# Patient Record
Sex: Female | Born: 1955 | Race: Black or African American | Hispanic: No | Marital: Single | Smoking: Never smoker
Health system: Southern US, Community
[De-identification: ages and names within clinical notes are randomized; demographics above are authoritative.]

## PROBLEM LIST (undated history)

## (undated) DIAGNOSIS — T7840XA Allergy, unspecified, initial encounter: Secondary | ICD-10-CM

## (undated) HISTORY — DX: Allergy, unspecified, initial encounter: T78.40XA

## (undated) HISTORY — PX: ABDOMINAL HYSTERECTOMY: SHX81

---

## 1998-12-06 ENCOUNTER — Other Ambulatory Visit: Admission: RE | Admit: 1998-12-06 | Discharge: 1998-12-06 | Payer: Self-pay | Admitting: Obstetrics and Gynecology

## 2000-04-02 ENCOUNTER — Encounter: Payer: Self-pay | Admitting: Obstetrics and Gynecology

## 2000-04-05 ENCOUNTER — Inpatient Hospital Stay (HOSPITAL_COMMUNITY): Admission: RE | Admit: 2000-04-05 | Discharge: 2000-04-08 | Payer: Self-pay | Admitting: Obstetrics and Gynecology

## 2001-08-08 ENCOUNTER — Ambulatory Visit (HOSPITAL_BASED_OUTPATIENT_CLINIC_OR_DEPARTMENT_OTHER): Admission: RE | Admit: 2001-08-08 | Discharge: 2001-08-08 | Payer: Self-pay | Admitting: Orthopedic Surgery

## 2002-09-23 ENCOUNTER — Emergency Department (HOSPITAL_COMMUNITY): Admission: EM | Admit: 2002-09-23 | Discharge: 2002-09-23 | Payer: Self-pay | Admitting: Emergency Medicine

## 2003-01-13 ENCOUNTER — Other Ambulatory Visit: Admission: RE | Admit: 2003-01-13 | Discharge: 2003-01-13 | Payer: Self-pay | Admitting: Obstetrics and Gynecology

## 2013-07-01 DIAGNOSIS — Z0000 Encounter for general adult medical examination without abnormal findings: Secondary | ICD-10-CM | POA: Insufficient documentation

## 2013-07-07 ENCOUNTER — Ambulatory Visit (INDEPENDENT_AMBULATORY_CARE_PROVIDER_SITE_OTHER): Payer: Managed Care, Other (non HMO) | Admitting: Emergency Medicine

## 2013-07-07 VITALS — BP 120/62 | HR 79 | Temp 98.3°F | Resp 16 | Ht 63.25 in | Wt 112.6 lb

## 2013-07-07 DIAGNOSIS — L509 Urticaria, unspecified: Secondary | ICD-10-CM

## 2013-07-07 DIAGNOSIS — R197 Diarrhea, unspecified: Secondary | ICD-10-CM

## 2013-07-07 LAB — POCT CBC
Granulocyte percent: 60.3 %G (ref 37–80)
HCT, POC: 43 % (ref 37.7–47.9)
Hemoglobin: 13 g/dL (ref 12.2–16.2)
Lymph, poc: 1.5 (ref 0.6–3.4)
MCH, POC: 27.2 pg (ref 27–31.2)
MCHC: 30.2 g/dL — AB (ref 31.8–35.4)
MCV: 90 fL (ref 80–97)
MID (cbc): 0.3 (ref 0–0.9)
MPV: 8.3 fL (ref 0–99.8)
POC Granulocyte: 2.8 (ref 2–6.9)
POC LYMPH PERCENT: 32.6 %L (ref 10–50)
POC MID %: 7.1 %M (ref 0–12)
Platelet Count, POC: 369 10*3/uL (ref 142–424)
RBC: 4.78 M/uL (ref 4.04–5.48)
RDW, POC: 12.3 %
WBC: 4.6 10*3/uL (ref 4.6–10.2)

## 2013-07-07 MED ORDER — LOPERAMIDE HCL 2 MG PO TABS: ORAL_TABLET | ORAL | Status: DC

## 2013-07-07 NOTE — Progress Notes (Signed)
[facility] [address], [place] [address] [phone]  Date:  07/07/2013   Name:  Tamara Cole   DOB:  [birth year]   MRN:  [id number]  PCP:  No PCP Per Patient    Chief Complaint: Sinus Congestion and Rash   History of Present Illness:  Tamara Cole is a 58 y.o. very pleasant female patient who presents with the following:  Treated a week ago with zpak for "URI".  She says she thought she had the flu with a fever and cough. Fever and cough have largely resolved.  Still has a mild cough that is productive of scant purulent mucous.  No fever or chills. No nausea or vomiting. No wheezing or shortness of breath.  Says that she developed diarrhea on Saturday. Nausea and vomiting have resolved.  Now has diarrhea that is watery started at 0400 today.  The patient has no complaint of blood, mucous, or pus in her stools. Has cramping abdominal pain.   Sunday developed a pruritic rash limited to her right buttock. No wheezing or shortness of breath.  There are no active problems to display for this patient.   Past Medical History  Diagnosis Date  . Allergy     History reviewed. No pertinent past surgical history.  History  Substance Use Topics  . Smoking status: Never Smoker   . Smokeless tobacco: Not on file  . Alcohol Use: No    Family History  Problem Relation Age of Onset  . Diabetes Mother     No Known Allergies  Medication list has been reviewed and updated.  No current outpatient prescriptions on file prior to visit.   No current facility-administered medications on file prior to visit.    Review of Systems:  As per HPI, otherwise negative.    Physical Examination: Filed Vitals:   07/07/13 1434  BP: 120/62  Pulse: 79  Temp: 98.3 F (36.8 C)  Resp: 16   Filed Vitals:   07/07/13 1434  Height: 5' 3.25" (1.607 m)  Weight: 112 lb 9.6 oz (51.075 kg)   Body mass index is 19.78 kg/(m^2). Ideal Body Weight: Weight in  (lb) to have BMI = 25: 142  GEN: WDWN, NAD, Non-toxic, A & O x 3 HEENT: Atraumatic, Normocephalic. Neck supple. No masses, No LAD. Ears and Nose: No external deformity. CV: RRR, No M/G/R. No JVD. No thrill. No extra heart sounds. PULM: CTA B, no wheezes, crackles, rhonchi. No retractions. No resp. distress. No accessory muscle use. ABD: S, NT, ND, hyperactive BS. No rebound. No HSM. EXTR: No c/c/e NEURO Normal gait.  PSYCH: Normally interactive. Conversant. Not depressed or anxious appearing.  Calm demeanor.  SKIN:  Pruritic rash right buttock  Assessment and Plan: Antibiotic associated diarrhea Consider C. Dif Labs Urticaria Avoid zithromax Benadryl imodium  Signed,  Phillips OdorJeffery Kirstina Leinweber, MD

## 2013-07-07 NOTE — Patient Instructions (Signed)
Clostridium Difficile Infection Clostridium difficile (C. difficile) is a bacteria found in the intestinal tract or colon. Under certain conditions, it causes diarrhea and sometimes severe disease. The severe form of the disease is known as pseudomembranous colitis (often called C. difficile colitis). This disease can damage the lining of the colon or cause the colon to become enlarged (toxic megacolon).  CAUSES  Your colon normally contains many different bacteria, including C. difficile. The balance of bacteria in your colon can change during illness. This is especially true when you take antibiotic medicine. Taking antibiotics may allow the C. difficile to grow, multiply excessively, and make a toxin that then causes illness. The elderly and people with certain medical conditions have a greater risk of getting C. difficile infections. SYMPTOMS   Watery diarrhea.  Fever.  Fatigue.  Loss of appetite.  Nausea.  Abdominal swelling, pain, or tenderness.  Dehydration. DIAGNOSIS  Your symptoms may make your caregiver suspicious of a C. difficile infection, especially if you have used antibiotics in the preceding weeks. However, there are only 2 ways to know for certain whether you have a C. difficile infection:  A lab test that finds the toxin in your stool.  The specific appearance of an abnormality (pseudomembrane) in your colon. This can only be seen by doing a sigmoidoscopy or colonoscopy. These procedures involve passing an instrument through your rectum to look at the inside of your colon. Your caregiver will help determine if these tests are necessary. TREATMENT   Most people are successfully treated with one of two specific antibiotics, usually given by mouth. Other antibiotics you are receiving are stopped if possible.  Intravenous (IV) fluids and correction of electrolyte imbalance may be necessary.  Rarely, surgery may be needed to remove the infected part of the  intestines.  Careful hand washing by you and your caregivers is important to prevent the spread of infection. In the hospital, your caregivers may also put on gowns and gloves to prevent the spread of the C. difficile bacteria. Your room is also cleaned regularly with a solution containing bleach or a product that is known to kill C. difficile. HOME CARE INSTRUCTIONS  Drink enough fluids to keep your urine clear or pale yellow. Avoid milk, caffeine, and alcohol.  Ask your caregiver for specific rehydration instructions.  Try eating small, frequent meals rather than large meals.  Take your antibiotics as directed. Finish them even if you start to feel better.  Do not use medicines to slow diarrhea. This could delay healing or cause complications.  Wash your hands thoroughly after using the bathroom and before preparing food.  Make sure people who live with you wash their hands often, too.  Carefully disinfect all surfaces with a product that contains chlorine bleach. SEEK MEDICAL CARE IF:  Diarrhea persists longer than expected or recurs after completing your course of antibiotic treatment for the C. difficile infection.  You have trouble staying hydrated. SEEK IMMEDIATE MEDICAL CARE IF:  You develop a new fever.  You have increasing abdominal pain or tenderness.  There is blood in your stools, or your stools are dark black and tarry.  You cannot hold down food or liquids. MAKE SURE YOU:   Understand these instructions.  Will watch your condition.  Will get help right away if you are not doing well or get worse. Document Released: 03/14/2005 Document Revised: 09/29/2012 Document Reviewed: 11/10/2010 [employer] Patient Information 2014 [employer].  

## 2013-07-09 LAB — CLOSTRIDIUM DIFFICILE EIA: CDIFTX: NEGATIVE

## 2015-01-24 ENCOUNTER — Ambulatory Visit (INDEPENDENT_AMBULATORY_CARE_PROVIDER_SITE_OTHER): Payer: Managed Care, Other (non HMO) | Admitting: Family Medicine

## 2015-01-24 VITALS — BP 138/70 | HR 61 | Temp 98.3°F | Resp 15 | Ht 63.5 in | Wt 114.0 lb

## 2015-01-24 DIAGNOSIS — Z0000 Encounter for general adult medical examination without abnormal findings: Secondary | ICD-10-CM | POA: Diagnosis not present

## 2015-01-24 DIAGNOSIS — Z833 Family history of diabetes mellitus: Secondary | ICD-10-CM | POA: Diagnosis not present

## 2015-01-24 NOTE — Patient Instructions (Signed)
We will let you know the results of your bloodwork  You should be contacted with regard to the referral for a mammogram. If you do not hear from someone in the next week about that please call back and ask what became of your mammogram referral.  Return at anytime if needed

## 2015-01-24 NOTE — Progress Notes (Signed)
Physical examination:  History: 59 year old lady here for annual physical examination. This required by her job. She has no major complaints.  Past medical history: Medications: None Allergies: None Past illnesses: No major illnesses Surgeries: Hysterectomy  Family history: Mother has diabetes and is still alive. Her father was killed. She has a couple of brothers and sisters all of whom are living and well.  Social history: Divorced twice. No children. Works for [employer] at [employer] in [place]. She has a paint headache. She plays some tennis. She lives alone. Does not smoke, drink, or use drugs. Occasionally goes to church. Does all her own work around the house.  Review of systems: Constitutional: Unremarkable HEENT: Unremarkable Restaurant: Unremarkable Cardiovascular: Unremarkable Gastrointestinal: Unremarkable Endocrinologic: Unremarkable Gastrointestinal: Genitourinary: Unremarkable Muscular skeletal: Unremarkable Dermatologic: Unremarkable Allergy/immunology: Unremarkable Neurological: Unremarkable Hematologic: Unremarkable Psychiatric: Unremarkable   Physical exam: Healthy lady in no major distress, pleasant, alert and oriented. TMs normal. Eyes PERRLA. Fundi benign. Throat clear. Teeth fairly good. Neck supple without nodes or thyromegaly. No carotid bruits. Chest is clear to all station. Heart regular without murmurs gallops or arrhythmias. No carotid bruits. Abdomen soft without organomegaly, masses, or tenderness. No axillary or inguinal nodes. Breasts normal without any masses. Extremities unremarkable. Skin unremarkable. Deep tender reflexes symmetrical 1+. Her pulses in her feet are good.  Assessment: Normal physical examination Family history of diabetes  Plan: Schedule mammogram Get screening labs including CBC, lipids, A1c, and cooperative metabolic panel

## 2015-01-25 LAB — CBC
HCT: 37.5 % (ref 36.0–46.0)
Hemoglobin: 12.3 g/dL (ref 12.0–15.0)
MCH: 27.3 pg (ref 26.0–34.0)
MCHC: 32.8 g/dL (ref 30.0–36.0)
MCV: 83.3 fL (ref 78.0–100.0)
MPV: 9.6 fL (ref 8.6–12.4)
Platelets: 361 10*3/uL (ref 150–400)
RBC: 4.5 MIL/uL (ref 3.87–5.11)
RDW: 12.8 % (ref 11.5–15.5)
WBC: 6.5 10*3/uL (ref 4.0–10.5)

## 2015-01-25 LAB — COMPREHENSIVE METABOLIC PANEL
ALT: 14 U/L (ref 6–29)
AST: 17 U/L (ref 10–35)
Albumin: 4.7 g/dL (ref 3.6–5.1)
Alkaline Phosphatase: 98 U/L (ref 33–130)
BUN: 12 mg/dL (ref 7–25)
CO2: 26 mmol/L (ref 20–31)
Calcium: 9.7 mg/dL (ref 8.6–10.4)
Chloride: 103 mmol/L (ref 98–110)
Creat: 0.64 mg/dL (ref 0.50–0.99)
Glucose, Bld: 87 mg/dL (ref 65–99)
Potassium: 3.9 mmol/L (ref 3.5–5.3)
Sodium: 141 mmol/L (ref 135–146)
Total Bilirubin: 0.6 mg/dL (ref 0.2–1.2)
Total Protein: 7.8 g/dL (ref 6.1–8.1)

## 2015-01-25 LAB — LIPID PANEL
Cholesterol: 267 mg/dL — ABNORMAL HIGH (ref 125–200)
HDL: 98 mg/dL (ref >=46)
LDL Cholesterol: 154 mg/dL — ABNORMAL HIGH (ref <130)
Total CHOL/HDL Ratio: 2.7 Ratio (ref <=5.0)
Triglycerides: 76 mg/dL (ref <150)
VLDL: 15 mg/dL (ref <30)

## 2015-01-25 LAB — HEMOGLOBIN A1C
Hgb A1c MFr Bld: 5.5 % (ref <5.7)
Mean Plasma Glucose: 111 mg/dL (ref <117)

## 2015-01-27 ENCOUNTER — Other Ambulatory Visit: Payer: Self-pay

## 2015-01-27 DIAGNOSIS — Z1231 Encounter for screening mammogram for malignant neoplasm of breast: Secondary | ICD-10-CM

## 2015-01-28 ENCOUNTER — Encounter: Payer: Self-pay | Admitting: Family Medicine

## 2016-01-19 ENCOUNTER — Ambulatory Visit (INDEPENDENT_AMBULATORY_CARE_PROVIDER_SITE_OTHER): Payer: Managed Care, Other (non HMO) | Admitting: Emergency Medicine

## 2016-01-19 ENCOUNTER — Encounter: Payer: Self-pay | Admitting: Emergency Medicine

## 2016-01-19 VITALS — BP 122/74 | HR 62 | Temp 98.1°F | Resp 17 | Ht 63.5 in | Wt 119.0 lb

## 2016-01-19 DIAGNOSIS — Z1211 Encounter for screening for malignant neoplasm of colon: Secondary | ICD-10-CM

## 2016-01-19 DIAGNOSIS — Z833 Family history of diabetes mellitus: Secondary | ICD-10-CM

## 2016-01-19 DIAGNOSIS — Z0000 Encounter for general adult medical examination without abnormal findings: Secondary | ICD-10-CM

## 2016-01-19 DIAGNOSIS — Z1159 Encounter for screening for other viral diseases: Secondary | ICD-10-CM | POA: Diagnosis not present

## 2016-01-19 LAB — POCT CBC
Granulocyte percent: 63.6 %G (ref 37–80)
HEMATOCRIT: 37.4 % — AB (ref 37.7–47.9)
HEMOGLOBIN: 12.6 g/dL (ref 12.2–16.2)
LYMPH, POC: 1.6 (ref 0.6–3.4)
MCH, POC: 28.1 pg (ref 27–31.2)
MCHC: 33.8 g/dL (ref 31.8–35.4)
MCV: 82.9 fL (ref 80–97)
MID (cbc): 0.1 (ref 0–0.9)
MPV: 7.6 fL (ref 0–99.8)
POC Granulocyte: 3 (ref 2–6.9)
POC LYMPH PERCENT: 33.9 %L (ref 10–50)
POC MID %: 2.5 % (ref 0–12)
Platelet Count, POC: 315 10*3/uL (ref 142–424)
RBC: 4.51 M/uL (ref 4.04–5.48)
RDW, POC: 13 %
WBC: 4.7 10*3/uL (ref 4.6–10.2)

## 2016-01-19 LAB — POCT GLYCOSYLATED HEMOGLOBIN (HGB A1C): Hemoglobin A1C: 5.3

## 2016-01-19 LAB — GLUCOSE, POCT (MANUAL RESULT ENTRY): POC Glucose: 83 mg/dl (ref 70–99)

## 2016-01-19 NOTE — Patient Instructions (Addendum)
IF you received an x-ray today, you will receive an invoice from [facility]. Please contact [facility] at [phone] with questions or concerns regarding your invoice.   IF you received labwork today, you will receive an invoice from [employer]. Please contact [facility] at [phone] with questions or concerns regarding your invoice.   Our billing staff will not be able to assist you with questions regarding bills from these companies.  You will be contacted with the lab results as soon as they are available. The fastest way to get your results is to activate your My Chart account. Instructions are located on the last page of this paperwork. If you have not heard from [place] regarding the results in 2 weeks, please contact this office.       We recommend that you schedule a mammogram for breast cancer screening. Typically, you do not need a referral to do this. Please contact a local imaging center to schedule your mammogram.  [facility] - [phone]  *ask for the Radiology [facility]) - [phone] or [phone]  [facility] - [phone] [place] [phone] [facility] - [phone]  *ask for the [facility] - [phone]  *ask for the Radiology Department [facility] - [phone]  *ask for the Mammography Department [facility] - [phone] Health mainHealth Maintenance, Female Adopting a healthy lifestyle and getting preventive care can go a long way to promote health and wellness. Talk with your health care provider about what schedule of regular examinations is right for you. This is a good chance for you to check in with your provider about disease prevention and staying healthy. In between checkups, there are plenty of things you can do on your own. Experts have done  a lot of research about which lifestyle changes and preventive measures are most likely to keep you healthy. Ask your health care provider for more information. WEIGHT AND DIET  Eat a healthy diet  Be sure to include plenty of vegetables, fruits, low-fat dairy products, and lean protein.  Do not eat a lot of foods high in solid fats, added sugars, or salt.  Get regular exercise. This is one of the most important things you can do for your health.  Most adults should exercise for at least 150 minutes each week. The exercise should increase your heart rate and make you sweat (moderate-intensity exercise).  Most adults should also do strengthening exercises at least twice a week. This is in addition to the moderate-intensity exercise.  Maintain a healthy weight  Body mass index (BMI) is a measurement that can be used to identify possible weight problems. It estimates body fat based on height and weight. Your health care provider can help determine your BMI and help you achieve or maintain a healthy weight.  For females 44 years of age and older:   A BMI below 18.5 is considered underweight.  A BMI of 18.5 to 24.9 is normal.  A BMI of 25 to 29.9 is considered overweight.  A BMI of 30 and above is considered obese.  Watch levels of cholesterol and blood lipids  You should start having your blood tested for lipids and cholesterol at 62 years of age, then have this test every 5 years.  You may need to have your cholesterol levels checked more often if:  Your lipid or cholesterol levels are  high.  You are older than 62 years of age.  You are at high risk for heart disease.  CANCER SCREENING   Lung Cancer  Lung cancer screening is recommended for adults 42-49 years old who are at high risk for lung cancer because of a history of smoking.  A yearly low-dose CT scan of the lungs is recommended for people who:  Currently smoke.  Have quit within the past 15 years.  Have at  least a 30-pack-year history of smoking. A pack year is smoking an average of one pack of cigarettes a day for 1 year.  Yearly screening should continue until it has been 15 years since you quit.  Yearly screening should stop if you develop a health problem that would prevent you from having lung cancer treatment.  Breast Cancer  Practice breast self-awareness. This means understanding how your breasts normally appear and feel.  It also means doing regular breast self-exams. Let your health care provider know about any changes, no matter how small.  If you are in your 20s or 30s, you should have a clinical breast exam (CBE) by a health care provider every 1-3 years as part of a regular health exam.  If you are 73 or older, have a CBE every year. Also consider having a breast X-ray (mammogram) every year.  If you have a family history of breast cancer, talk to your health care provider about genetic screening.  If you are at high risk for breast cancer, talk to your health care provider about having an MRI and a mammogram every year.  Breast cancer gene (BRCA) assessment is recommended for women who have family members with BRCA-related cancers. BRCA-related cancers include:  Breast.  Ovarian.  Tubal.  Peritoneal cancers.  Results of the assessment will determine the need for genetic counseling and BRCA1 and BRCA2 testing. Cervical Cancer Your health care provider may recommend that you be screened regularly for cancer of the pelvic organs (ovaries, uterus, and vagina). This screening involves a pelvic examination, including checking for microscopic changes to the surface of your cervix (Pap test). You may be encouraged to have this screening done every 3 years, beginning at age 75.  For women ages 1-65, health care providers may recommend pelvic exams and Pap testing every 3 years, or they may recommend the Pap and pelvic exam, combined with testing for human papilloma virus (HPV),  every 5 years. Some types of HPV increase your risk of cervical cancer. Testing for HPV may also be done on women of any age with unclear Pap test results.  Other health care providers may not recommend any screening for nonpregnant women who are considered low risk for pelvic cancer and who do not have symptoms. Ask your health care provider if a screening pelvic exam is right for you.  If you have had past treatment for cervical cancer or a condition that could lead to cancer, you need Pap tests and screening for cancer for at least 20 years after your treatment. If Pap tests have been discontinued, your risk factors (such as having a new sexual partner) need to be reassessed to determine if screening should resume. Some women have medical problems that increase the chance of getting cervical cancer. In these cases, your health care provider may recommend more frequent screening and Pap tests. Colorectal Cancer  This type of cancer can be detected and often prevented.  Routine colorectal cancer screening usually begins at 62 years of age and continues through 62  years of age.  Your health care provider may recommend screening at an earlier age if you have risk factors for colon cancer.  Your health care provider may also recommend using home test kits to check for hidden blood in the stool.  A small camera at the end of a tube can be used to examine your colon directly (sigmoidoscopy or colonoscopy). This is done to check for the earliest forms of colorectal cancer.  Routine screening usually begins at age 103.  Direct examination of the colon should be repeated every 5-10 years through 62 years of age. However, you may need to be screened more often if early forms of precancerous polyps or small growths are found. Skin Cancer  Check your skin from head to toe regularly.  Tell your health care provider about any new moles or changes in moles, especially if there is a change in a mole's shape  or color.  Also tell your health care provider if you have a mole that is larger than the size of a pencil eraser.  Always use sunscreen. Apply sunscreen liberally and repeatedly throughout the day.  Protect yourself by wearing long sleeves, pants, a wide-brimmed hat, and sunglasses whenever you are outside. HEART DISEASE, DIABETES, AND HIGH BLOOD PRESSURE   High blood pressure causes heart disease and increases the risk of stroke. High blood pressure is more likely to develop in:  People who have blood pressure in the high end of the normal range (130-139/85-89 mm Hg).  People who are overweight or obese.  People who are African American.  If you are 68-3 years of age, have your blood pressure checked every 3-5 years. If you are 18 years of age or older, have your blood pressure checked every year. You should have your blood pressure measured twice--once when you are at a hospital or clinic, and once when you are not at a hospital or clinic. Record the average of the two measurements. To check your blood pressure when you are not at a hospital or clinic, you can use:  An automated blood pressure machine at a pharmacy.  A home blood pressure monitor.  If you are between 55 years and 64 years old, ask your health care provider if you should take aspirin to prevent strokes.  Have regular diabetes screenings. This involves taking a blood sample to check your fasting blood sugar level.  If you are at a normal weight and have a low risk for diabetes, have this test once every three years after 62 years of age.  If you are overweight and have a high risk for diabetes, consider being tested at a younger age or more often. PREVENTING INFECTION  Hepatitis B  If you have a higher risk for hepatitis B, you should be screened for this virus. You are considered at high risk for hepatitis B if:  You were born in a country where hepatitis B is common. Ask your health care provider which  countries are considered high risk.  Your parents were born in a high-risk country, and you have not been immunized against hepatitis B (hepatitis B vaccine).  You have HIV or AIDS.  You use needles to inject street drugs.  You live with someone who has hepatitis B.  You have had sex with someone who has hepatitis B.  You get hemodialysis treatment.  You take certain medicines for conditions, including cancer, organ transplantation, and autoimmune conditions. Hepatitis C  Blood testing is recommended for:  Everyone born  from [place] through 1967.  Anyone with known risk factors for hepatitis C. Sexually transmitted infections (STIs)  You should be screened for sexually transmitted infections (STIs) including gonorrhea and chlamydia if:  You are sexually active and are younger than 62 years of age.  You are older than 62 years of age and your health care provider tells you that you are at risk for this type of infection.  Your sexual activity has changed since you were last screened and you are at an increased risk for chlamydia or gonorrhea. Ask your health care provider if you are at risk.  If you do not have HIV, but are at risk, it may be recommended that you take a prescription medicine daily to prevent HIV infection. This is called pre-exposure prophylaxis (PrEP). You are considered at risk if:  You are sexually active and do not regularly use condoms or know the HIV status of your partner(s).  You take drugs by injection.  You are sexually active with a partner who has HIV. Talk with your health care provider about whether you are at high risk of being infected with HIV. If you choose to begin PrEP, you should first be tested for HIV. You should then be tested every 3 months for as long as you are taking PrEP.  PREGNANCY   If you are premenopausal and you may become pregnant, ask your health care provider about preconception counseling.  If you may become pregnant, take  400 to 800 micrograms (mcg) of folic acid every day.  If you want to prevent pregnancy, talk to your health care provider about birth control (contraception). OSTEOPOROSIS AND MENOPAUSE   Osteoporosis is a disease in which the bones lose minerals and strength with aging. This can result in serious bone fractures. Your risk for osteoporosis can be identified using a bone density scan.  If you are 93 years of age or older, or if you are at risk for osteoporosis and fractures, ask your health care provider if you should be screened.  Ask your health care provider whether you should take a calcium or vitamin D supplement to lower your risk for osteoporosis.  Menopause may have certain physical symptoms and risks.  Hormone replacement therapy may reduce some of these symptoms and risks. Talk to your health care provider about whether hormone replacement therapy is right for you.  HOME CARE INSTRUCTIONS   Schedule regular health, dental, and eye exams.  Stay current with your immunizations.   Do not use any tobacco products including cigarettes, chewing tobacco, or electronic cigarettes.  If you are pregnant, do not drink alcohol.  If you are breastfeeding, limit how much and how often you drink alcohol.  Limit alcohol intake to no more than 1 drink per day for nonpregnant women. One drink equals 12 ounces of beer, 5 ounces of wine, or 1 ounces of hard liquor.  Do not use street drugs.  Do not share needles.  Ask your health care provider for help if you need support or information about quitting drugs.  Tell your health care provider if you often feel depressed.  Tell your health care provider if you have ever been abused or do not feel safe at home.   This information is not intended to replace advice given to you by your health care provider. Make sure you discuss any questions you have with your health care provider.   Document Released: 12/18/2010 Document Revised:  06/25/2014 Document Reviewed: 05/06/2013 Elsevier Interactive Patient Education  2016 [employer].

## 2016-01-19 NOTE — Progress Notes (Signed)
Patient ID: Tamara Cole, female   DOB: 08-16-1955, 60 y.o.   MRN: [id number]    By signing my name below I, Tamara Cole, attest that this documentation has been prepared under the direction and in the presence of Lesle Chris, MD. Electonically Signed. Tamara Cole, Scribe 01/19/2016 at 3:21 PM  Chief Complaint:  Chief Complaint  Patient presents with  . Annual Exam    urine collected    HPI: Tamara Cole is a 60 y.o. female who reports to [facility] today for annual exam for work.   Pt denies history of HTN, DM, or HLD. Pt's mother has DM. No other family history of DM.   Pt is scheduled for a mammogram. Pt has had a colonoscopy within the past 5 years approximately.  Pt reports going to see her eye doctor annually.    Pt has not eaten today.  Pt has had a hysterectomy.   Pt's last tetanus shot was 3 years ago.   Pt works at [employer] in [place].    Past Medical History:  Diagnosis Date  . Allergy    Past Surgical History:  Procedure Laterality Date  . ABDOMINAL HYSTERECTOMY     Social History   Social History  . Marital status: Single    Spouse name: N/A  . Number of children: N/A  . Years of education: N/A   Social History Main Topics  . Smoking status: Never Smoker  . Smokeless tobacco: None  . Alcohol use No  . Drug use: No  . Sexual activity: Not Asked   Other Topics Concern  . None   Social History Narrative  . None   Family History  Problem Relation Age of Onset  . Diabetes Mother    No Known Allergies Prior to Admission medications   Not on File     ROS: The patient denies fevers, chills, night sweats, unintentional weight loss, chest pain, palpitations, wheezing, dyspnea on exertion, nausea, vomiting, abdominal pain, dysuria, hematuria, melena, numbness, weakness, or tingling.   All other systems have been reviewed and were otherwise negative with the exception of those mentioned in the HPI and as above.    PHYSICAL  EXAM: Vitals:   01/19/16 1439  BP: 122/74  Pulse: 62  Resp: 17  Temp: 98.1 F (36.7 C)   Body mass index is 20.75 kg/m.   General: Alert, no acute distress HEENT:  Normocephalic, atraumatic, oropharynx patent. TMs normal bilat. No thyromegaly.  Eye: [facility] Cardiovascular:  Regular rate and rhythm, no rubs murmurs or gallops.  No Carotid bruits, radial pulse intact. No pedal edema.  Respiratory: Clear to auscultation bilaterally.  No wheezes, rales, or rhonchi.  No cyanosis, no use of accessory musculature Abdominal: No organomegaly, abdomen is soft and non-tender, positive bowel sounds.  No masses. Musculoskeletal: Gait intact. No edema, tenderness Skin: No rashes. Neurologic: Facial musculature symmetric. Psychiatric: Patient acts appropriately throughout our interaction. Lymphatic: No cervical or submandibular lymphadenopathy GU: Breasts normal.   Body mass index is 20.75 kg/m. LABS:  Results for orders placed or performed in visit on 01/19/16  POCT CBC  Result Value Ref Range   WBC 4.7 4.6 - 10.2 K/uL   Lymph, poc 1.6 0.6 - 3.4   POC LYMPH PERCENT 33.9 10 - 50 %L   MID (cbc) 0.1 0 - 0.9   POC MID % 2.5 0 - 12 %M   POC Granulocyte 3.0 2 - 6.9   Granulocyte percent 63.6 37 - 80 %G  RBC 4.51 4.04 - 5.48 M/uL   Hemoglobin 12.6 12.2 - 16.2 g/dL   HCT, POC 47.0 (A) 96.2 - 47.9 %   MCV 82.9 80 - 97 fL   MCH, POC 28.1 27 - 31.2 pg   MCHC 33.8 31.8 - 35.4 g/dL   RDW, POC 83.6 %   Platelet Count, POC 315 142 - 424 K/uL   MPV 7.6 0 - 99.8 fL  POCT glucose (manual entry)  Result Value Ref Range   POC Glucose 83 70 - 99 mg/dl  POCT glycosylated hemoglobin (Hb A1C)  Result Value Ref Range   Hemoglobin A1C 5.3     EKG/XRAY:   Primary read interpreted by Dr. Cleta Alberts at [facility].   ASSESSMENT/PLAN: Physical exam performed with routine lab screening. Referral made to GI for colonoscopy. Her form was signed.she has a healthy lifestyle no abnormalities on exam.I  personally performed the services described in this documentation, which was scribed in my presence. The recorded information has been reviewed and is accurate.    Gross sideeffects, risk and benefits, and alternatives of medications d/w patient. Patient is aware that all medications have potential sideeffects and we are unable to predict every sideeffect or drug-drug interaction that may occur.  Lesle Chris MD 01/19/2016 3:08 PM

## 2016-01-20 LAB — COMPLETE METABOLIC PANEL WITH GFR
ALT: 18 U/L (ref 6–29)
AST: 20 U/L (ref 10–35)
Albumin: 4.4 g/dL (ref 3.6–5.1)
Alkaline Phosphatase: 96 U/L (ref 33–130)
BILIRUBIN TOTAL: 0.9 mg/dL (ref 0.2–1.2)
BUN: 11 mg/dL (ref 7–25)
CALCIUM: 9.6 mg/dL (ref 8.6–10.4)
CO2: 25 mmol/L (ref 20–31)
CREATININE: 0.62 mg/dL (ref 0.50–0.99)
Chloride: 106 mmol/L (ref 98–110)
Glucose, Bld: 87 mg/dL (ref 65–99)
Potassium: 4 mmol/L (ref 3.5–5.3)
Sodium: 143 mmol/L (ref 135–146)
TOTAL PROTEIN: 7.7 g/dL (ref 6.1–8.1)

## 2016-01-20 LAB — LIPID PANEL
CHOLESTEROL: 264 mg/dL — AB (ref 125–200)
HDL: 103 mg/dL (ref >=46)
LDL Cholesterol: 146 mg/dL — ABNORMAL HIGH (ref <130)
TRIGLYCERIDES: 76 mg/dL (ref <150)
Total CHOL/HDL Ratio: 2.6 Ratio (ref <=5.0)
VLDL: 15 mg/dL (ref <30)

## 2016-01-20 LAB — HEPATITIS C ANTIBODY: HCV Ab: NEGATIVE

## 2016-01-21 ENCOUNTER — Encounter: Payer: Self-pay | Admitting: *Deleted

## 2016-03-22 ENCOUNTER — Encounter: Payer: Self-pay | Admitting: Emergency Medicine

## 2017-02-22 ENCOUNTER — Encounter: Payer: Managed Care, Other (non HMO) | Admitting: Family Medicine

## 2017-02-23 ENCOUNTER — Encounter: Payer: Self-pay | Admitting: Family Medicine

## 2017-02-23 ENCOUNTER — Ambulatory Visit (INDEPENDENT_AMBULATORY_CARE_PROVIDER_SITE_OTHER): Payer: PRIVATE HEALTH INSURANCE | Admitting: Family Medicine

## 2017-02-23 VITALS — BP 118/71 | HR 65 | Temp 98.1°F | Resp 16 | Ht 63.0 in | Wt 103.0 lb

## 2017-02-23 DIAGNOSIS — E785 Hyperlipidemia, unspecified: Secondary | ICD-10-CM | POA: Diagnosis not present

## 2017-02-23 DIAGNOSIS — Z23 Encounter for immunization: Secondary | ICD-10-CM | POA: Diagnosis not present

## 2017-02-23 DIAGNOSIS — Z0000 Encounter for general adult medical examination without abnormal findings: Secondary | ICD-10-CM

## 2017-02-23 DIAGNOSIS — Z1231 Encounter for screening mammogram for malignant neoplasm of breast: Secondary | ICD-10-CM

## 2017-02-23 DIAGNOSIS — Z1211 Encounter for screening for malignant neoplasm of colon: Secondary | ICD-10-CM | POA: Diagnosis not present

## 2017-02-23 DIAGNOSIS — Z1239 Encounter for other screening for malignant neoplasm of breast: Secondary | ICD-10-CM

## 2017-02-23 NOTE — Patient Instructions (Addendum)
Nice meeting you.  We will refer you for colonoscopy and mammogram.  I will check some blood work for blood sugar, and cholesterol. Cholesterol was elevated the last year. Depending on that reading today, can decide if medication is needed.  HIV tests is recommended for all adults once. If you would like to have that added to your bloodwork today, please let me know by Tuesday.  Keeping You Healthy  Get These Tests  Blood Pressure- Have your blood pressure checked by your healthcare provider at least once a year.  Normal blood pressure is 120/80.  Weight- Have your body mass index (BMI) calculated to screen for obesity.  BMI is a measure of body fat based on height and weight.  You can calculate your own BMI at https://www.west-esparza.com/  Cholesterol- Have your cholesterol checked every year.  Diabetes- Have your blood sugar checked every year if you have high blood pressure, high cholesterol, a family history of diabetes or if you are overweight.  Pap Test - Have a pap test every 1 to 5 years if you have been sexually active.  If you are older than 65 and recent pap tests have been normal you may not need additional pap tests.  In addition, if you have had a hysterectomy  for benign disease additional pap tests are not necessary.  Mammogram-Yearly mammograms are essential for early detection of breast cancer  Screening for Colon Cancer- Colonoscopy starting at age 61. Screening may begin sooner depending on your family history and other health conditions.  Follow up colonoscopy as directed by your Gastroenterologist.  Screening for Osteoporosis- Screening begins at age 34 with bone density scanning, sooner if you are at higher risk for developing Osteoporosis.  Get these medicines  Calcium with Vitamin D- Your body requires 1200-1500 mg of Calcium a day and [phone] IU of Vitamin D a day.  You can only absorb 500 mg of Calcium at a time therefore Calcium must be taken in 2 or 3 separate  doses throughout the day.  Hormones- Hormone therapy has been associated with increased risk for certain cancers and heart disease.  Talk to your healthcare provider about if you need relief from menopausal symptoms.  Aspirin- Ask your healthcare provider about taking Aspirin to prevent Heart Disease and Stroke.  Get these Immuniztions  Flu shot- Every fall  Pneumonia shot- Once after the age of 66; if you are younger ask your healthcare provider if you need a pneumonia shot.  Tetanus- Every ten years.  Zostavax- Once after the age of 56 to prevent shingles.  Take these steps  Don't smoke- Your healthcare provider can help you quit. For tips on how to quit, ask your healthcare provider or go to www.smokefree.gov or call 1-800 QUIT-NOW.  Be physically active- Exercise 5 days a week for a minimum of 30 minutes.  If you are not already physically active, start slow and gradually work up to 30 minutes of moderate physical activity.  Try walking, dancing, bike riding, swimming, etc.  Eat a healthy diet- Eat a variety of healthy foods such as fruits, vegetables, whole grains, low fat milk, low fat cheeses, yogurt, lean meats, chicken, fish, eggs, dried beans, tofu, etc.  For more information go to www.thenutritionsource.org  Dental visit- Brush and floss teeth twice daily; visit your dentist twice a year.  Eye exam- Visit your Optometrist or Ophthalmologist yearly.  Drink alcohol in moderation- Limit alcohol intake to one drink or less a day.  Never drink and drive.  Depression- Your emotional health is as important as your physical health.  If you're feeling down or losing interest in things you normally enjoy, please talk to your healthcare provider.  Seat Belts- can save your life; always wear one  Smoke/Carbon Monoxide detectors- These detectors need to be installed on the appropriate level of your home.  Replace batteries at least once a year.  Violence- If anyone is threatening or  hurting you, please tell your healthcare provider.  Living Will/ Health care power of attorney- Discuss with your healthcare provider and family.     IF you received an x-ray today, you will receive an invoice from [facility]. Please contact [facility] at [phone] with questions or concerns regarding your invoice.   IF you received labwork today, you will receive an invoice from [facility]. Please contact [facility] at [phone] with questions or concerns regarding your invoice.   Our billing staff will not be able to assist you with questions regarding bills from these companies.  You will be contacted with the lab results as soon as they are available. The fastest way to get your results is to activate your My Chart account. Instructions are located on the last page of this paperwork. If you have not heard from us regarding the results in 2 weeks, please contact this office.

## 2017-02-23 NOTE — Progress Notes (Signed)
Subjective:  By signing my name below, I, Stann Oresung-Kai Tsai, attest that this documentation has been prepared under the direction and in the presence of Meredith StaggersJeffrey Meilech Virts, MD. Electronically Signed: Stann Oresung-Kai Tsai, Scribe. 02/23/2017 , 10:09 AM .  Patient was seen in Room 10 .   Patient ID: Tamara Cole, female    DOB: Apr 11, 1956, 61 y.o.   MRN: [id number] Chief Complaint  Patient presents with  . Annual Exam   HPI  Tamara Cole is a 61 y.o. female  Here for annual physical. She was previously seen by Dr. Cleta Albertsaub for physical in 2017; no concerns at that time. She was referred for colonoscopy. Overall, she had normal labs, except hyperlipidemia.   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   Hyperlipidemia Lab Results  Component Value Date   CHOL 264 (H) 01/19/2016   HDL 103 01/19/2016   LDLCALC 146 (H) 01/19/2016   TRIG 76 01/19/2016   CHOLHDL 2.6 01/19/2016   Lab Results  Component Value Date   ALT 18 01/19/2016   AST 20 01/19/2016   ALKPHOS 96 01/19/2016   BILITOT 0.9 01/19/2016   She was recommended Lipitor, recheck in 3-6 months. She hasn't taken medication for this.   Recent deaths in family Brother: passed in March, to cancer in his throat Mother: passed in July, due to brain aneurysm, at 63 years old  Cancer Screening Colonoscopy: Referred last year as above, but unable to contact patient to schedule, so referral was closed. Wasn't rescheduled due to family emergencies.  Breast cancer screening: recommended to schedule mammogram; referral placed today.  Cervical cancer screening: s/p hysterectomy due to fibroids.  Bone density testing: She had this done several years ago, which was negative; denies family history of osteoporosis. She denies history of smoking.   Immunizations  There is no immunization history on file for this patient.  Tetanus: Tdap received on Sept 20th, 2012 Flu  shot: declines flu shot, as she receives flu shot at work.   HIV screening- She declines screening today, unsure if insurance covers; she denies being sexually active.  Hep C screening- done in Aug 2017, negative.   Depression Depression screen [facility] 2/[facility] 02/23/2017 01/19/2016 01/24/2015  Decreased Interest 0 0 0  Down, Depressed, Hopeless 0 0 0  PHQ - 2 Score 0 0 0    Vision  Visual Acuity Screening   Right eye Left eye Both eyes  Without correction:     With correction: 20/20 20/20 20/20    She saw eye doctor earlier this year; follows once a year.   Dentist She hasn't seen dentist in a few years; plans to schedule.   Exercise She works a physical job.  Exercise daily through activity at work  Family history of DM Her mother had DM.   Lab Results  Component Value Date   HGBA1C 5.3 01/19/2016    Patient Active Problem List   Diagnosis Date Noted  . Family history of diabetes mellitus 01/24/2015   Past Medical History:  Diagnosis Date  . Allergy    Past Surgical History:  Procedure Laterality Date  . ABDOMINAL HYSTERECTOMY     No Known Allergies Prior to Admission medications   Not on File   Social History   Social History  . Marital status: Single    Spouse name: N/A  . Number of children: N/A  . Years of education: N/A   Occupational History  .  Not on file.   Social History Main Topics  . Smoking status: Never Smoker  . Smokeless tobacco: Never Used  . Alcohol use No  . Drug use: No  . Sexual activity: Not on file   Other Topics Concern  . Not on file   Social History Narrative  . No narrative on file   Review of Systems 13 point ROS - negative     Objective:   Physical Exam  Constitutional: She is oriented to person, place, and time. She appears well-developed and well-nourished.  HENT:  Head: Normocephalic and atraumatic.  Right Ear: External ear normal.  Left Ear: External ear normal.  Mouth/Throat: Oropharynx is clear and moist.  Eyes:  Pupils are equal, round, and reactive to light. Conjunctivae are normal.  Neck: Normal range of motion. Neck supple. No thyromegaly present.  Cardiovascular: Normal rate, regular rhythm, normal heart sounds and intact distal pulses.   No murmur heard. Pulmonary/Chest: Effort normal and breath sounds normal. No respiratory distress. She has no wheezes.  Abdominal: Soft. Bowel sounds are normal. There is no tenderness.  Musculoskeletal: Normal range of motion. She exhibits no edema or tenderness.  Lymphadenopathy:    She has no cervical adenopathy.  Neurological: She is alert and oriented to person, place, and time.  Skin: Skin is warm and dry. No rash noted.  Psychiatric: She has a normal mood and affect. Her behavior is normal. Thought content normal.  Vitals reviewed.    Vitals:   02/23/17 0922  BP: 118/71  Pulse: 65  Resp: 16  Temp: 98.1 F (36.7 C)  TempSrc: Oral  SpO2: 99%  Weight: 103 lb (46.7 kg)  Height:  (1.6 m)      Assessment & Plan:   Tamara Cole is a 61 y.o. female Annual physical exam  - -anticipatory guidance as below in AVS, screening labs above. Health maintenance items as above in HPI discussed/recommended as applicable.   Need for prophylactic vaccination and inoculation against influenza  - Plans to receive that vaccination at work  Hyperlipidemia, unspecified hyperlipidemia type - Plan: Comprehensive metabolic panel, Lipid panel  -Check labs, then discuss potential medications based on ASCVD risk  Screen for colon cancer - Plan: Ambulatory referral to Gastroenterology  Screening for breast cancer - Plan: MM DIGITAL SCREENING BILATERAL   No orders of the defined types were placed in this encounter.  Patient Instructions   Nice meeting you.  We will refer you for colonoscopy and mammogram.  I will check some blood work for blood sugar, and cholesterol. Cholesterol was elevated the last year. Depending on that reading today, can  decide if medication is needed.  HIV tests is recommended for all adults once. If you would like to have that added to your bloodwork today, please let me know by Tuesday.  Keeping You Healthy  Get These Tests  Blood Pressure- Have your blood pressure checked by your healthcare provider at least once a year.  Normal blood pressure is 120/80.  Weight- Have your body mass index (BMI) calculated to screen for obesity.  BMI is a measure of body fat based on height and weight.  You can calculate your own BMI at https://www.west-esparza.com/  Cholesterol- Have your cholesterol checked every year.  Diabetes- Have your blood sugar checked every year if you have high blood pressure, high cholesterol, a family history of diabetes or if you are overweight.  Pap Test - Have a pap test every 1 to 5 years if you have  been sexually active.  If you are older than 65 and recent pap tests have been normal you may not need additional pap tests.  In addition, if you have had a hysterectomy  for benign disease additional pap tests are not necessary.  Mammogram-Yearly mammograms are essential for early detection of breast cancer  Screening for Colon Cancer- Colonoscopy starting at age 74. Screening may begin sooner depending on your family history and other health conditions.  Follow up colonoscopy as directed by your Gastroenterologist.  Screening for Osteoporosis- Screening begins at age 34 with bone density scanning, sooner if you are at higher risk for developing Osteoporosis.  Get these medicines  Calcium with Vitamin D- Your body requires 1200-1500 mg of Calcium a day and [phone] IU of Vitamin D a day.  You can only absorb 500 mg of Calcium at a time therefore Calcium must be taken in 2 or 3 separate doses throughout the day.  Hormones- Hormone therapy has been associated with increased risk for certain cancers and heart disease.  Talk to your healthcare provider about if you need relief from menopausal  symptoms.  Aspirin- Ask your healthcare provider about taking Aspirin to prevent Heart Disease and Stroke.  Get these Immuniztions  Flu shot- Every fall  Pneumonia shot- Once after the age of 50; if you are younger ask your healthcare provider if you need a pneumonia shot.  Tetanus- Every ten years.  Zostavax- Once after the age of 79 to prevent shingles.  Take these steps  Don't smoke- Your healthcare provider can help you quit. For tips on how to quit, ask your healthcare provider or go to www.smokefree.gov or call 1-800 QUIT-NOW.  Be physically active- Exercise 5 days a week for a minimum of 30 minutes.  If you are not already physically active, start slow and gradually work up to 30 minutes of moderate physical activity.  Try walking, dancing, bike riding, swimming, etc.  Eat a healthy diet- Eat a variety of healthy foods such as fruits, vegetables, whole grains, low fat milk, low fat cheeses, yogurt, lean meats, chicken, fish, eggs, dried beans, tofu, etc.  For more information go to www.thenutritionsource.org  Dental visit- Brush and floss teeth twice daily; visit your dentist twice a year.  Eye exam- Visit your Optometrist or Ophthalmologist yearly.  Drink alcohol in moderation- Limit alcohol intake to one drink or less a day.  Never drink and drive.  Depression- Your emotional health is as important as your physical health.  If you're feeling down or losing interest in things you normally enjoy, please talk to your healthcare provider.  Seat Belts- can save your life; always wear one  Smoke/Carbon Monoxide detectors- These detectors need to be installed on the appropriate level of your home.  Replace batteries at least once a year.  Violence- If anyone is threatening or hurting you, please tell your healthcare provider.  Living Will/ Health care power of attorney- Discuss with your healthcare provider and family.     IF you received an x-ray today, you will receive an  invoice from [facility]. Please contact [facility] at [phone] with questions or concerns regarding your invoice.   IF you received labwork today, you will receive an invoice from [place]. Please contact [facility] at [phone] with questions or concerns regarding your invoice.   Our billing staff will not be able to assist you with questions regarding bills from these companies.  You will be contacted with the lab results as soon as they  are available. The fastest way to get your results is to activate your My Chart account. Instructions are located on the last page of this paperwork. If you have not heard from [place] regarding the results in 2 weeks, please contact this office.       I personally performed the services described in this documentation, which was scribed in my presence. The recorded information has been reviewed and considered for accuracy and completeness, addended by me as needed, and agree with information above.  Signed,   Meredith Staggers, MD Primary Care at [facility].  02/24/17 3:06 PM

## 2017-02-24 LAB — COMPREHENSIVE METABOLIC PANEL
A/G RATIO: 1.8 (ref 1.2–2.2)
ALT: 15 IU/L (ref 0–32)
AST: 19 IU/L (ref 0–40)
Albumin: 4.7 g/dL (ref 3.6–4.8)
Alkaline Phosphatase: 78 IU/L (ref 39–117)
BILIRUBIN TOTAL: 0.7 mg/dL (ref 0.0–1.2)
BUN/Creatinine Ratio: 26 (ref 12–28)
BUN: 18 mg/dL (ref 8–27)
CHLORIDE: 100 mmol/L (ref 96–106)
CO2: 19 mmol/L — ABNORMAL LOW (ref 20–29)
Calcium: 9.8 mg/dL (ref 8.7–10.3)
Creatinine, Ser: 0.7 mg/dL (ref 0.57–1.00)
GFR calc non Af Amer: 93 mL/min/{1.73_m2} (ref >59)
GFR, EST AFRICAN AMERICAN: 107 mL/min/{1.73_m2} (ref >59)
GLOBULIN, TOTAL: 2.6 g/dL (ref 1.5–4.5)
Glucose: 92 mg/dL (ref 65–99)
POTASSIUM: 4.5 mmol/L (ref 3.5–5.2)
SODIUM: 140 mmol/L (ref 134–144)
TOTAL PROTEIN: 7.3 g/dL (ref 6.0–8.5)

## 2017-02-24 LAB — LIPID PANEL
Chol/HDL Ratio: 2.3 ratio (ref 0.0–4.4)
Cholesterol, Total: 241 mg/dL — ABNORMAL HIGH (ref 100–199)
HDL: 104 mg/dL (ref >39)
LDL Calculated: 126 mg/dL — ABNORMAL HIGH (ref 0–99)
Triglycerides: 53 mg/dL (ref 0–149)
VLDL Cholesterol Cal: 11 mg/dL (ref 5–40)

## 2017-04-08 ENCOUNTER — Encounter: Payer: Self-pay | Admitting: Family Medicine

## 2018-02-10 ENCOUNTER — Ambulatory Visit (INDEPENDENT_AMBULATORY_CARE_PROVIDER_SITE_OTHER): Payer: PRIVATE HEALTH INSURANCE | Admitting: Family Medicine

## 2018-02-10 ENCOUNTER — Encounter: Payer: Self-pay | Admitting: Family Medicine

## 2018-02-10 ENCOUNTER — Other Ambulatory Visit: Payer: Self-pay

## 2018-02-10 VITALS — BP 128/78 | HR 64 | Temp 98.4°F | Ht 63.0 in | Wt 109.2 lb

## 2018-02-10 DIAGNOSIS — Z1211 Encounter for screening for malignant neoplasm of colon: Secondary | ICD-10-CM | POA: Diagnosis not present

## 2018-02-10 DIAGNOSIS — Z833 Family history of diabetes mellitus: Secondary | ICD-10-CM

## 2018-02-10 DIAGNOSIS — Z1231 Encounter for screening mammogram for malignant neoplasm of breast: Secondary | ICD-10-CM

## 2018-02-10 DIAGNOSIS — Z0000 Encounter for general adult medical examination without abnormal findings: Secondary | ICD-10-CM | POA: Diagnosis not present

## 2018-02-10 DIAGNOSIS — E785 Hyperlipidemia, unspecified: Secondary | ICD-10-CM

## 2018-02-10 DIAGNOSIS — Z1239 Encounter for other screening for malignant neoplasm of breast: Secondary | ICD-10-CM

## 2018-02-10 NOTE — Progress Notes (Signed)
Subjective:  By signing my name below, I, Tamara Cole, attest that this documentation has been prepared under the direction and in the presence of Shade Flood, MD Electronically Signed: Charline Bills, ED Scribe 02/10/2018 at 4:05 PM.   Patient ID: Tamara Cole, female    DOB: 12-13-1955, 62 y.o.   MRN: [id number]  Chief Complaint  Patient presents with  . Annual Exam    CPE forms to be completed for employment   HPI Tamara Cole is a 62 y.o. female who presents to Primary Care at [facility] for an annual exam. Last CPE 02/2017. Noted to have hyperlipidemia in the past, no other chronic med probs. Pt is fasting at this visit.  Hyperlipidemia Lab Results  Component Value Date   CHOL 241 (H) 02/23/2017   HDL 104 02/23/2017   LDLCALC 126 (H) 02/23/2017   TRIG 53 02/23/2017   CHOLHDL 2.3 02/23/2017   Lab Results  Component Value Date   ALT 15 02/23/2017   AST 19 02/23/2017   ALKPHOS 78 02/23/2017   BILITOT 0.7 02/23/2017  LDL improved from 146 to 126. Remained to have a great HDL of 104.  CA Screening Colonoscopy: recommended last yr rescheduling as it had not been performed; states that she has had a hard yr with her family but promises to have it done this yr. States she has had a colonoscopy sev yrs ago and a home test done. Breast CA Screening: mammogram ordered last yr, not performed. Pt promises to have it done this yr. Denies new lumps/bumps. Cervical CA Screening: hysterectomy due to fibroids  Immunizations  There is no immunization history on file for this patient.  Tdap: 03/08/11 Flu: usually gets vaccine at work but declined this yr Shingrix: pt defers vaccine  Fall Screening No falls within the past yr.  Depression Screening Depression screen [facility] 2/9 02/10/2018 02/23/2017 01/19/2016 01/24/2015  Decreased Interest 0 0 0 0  Down, Depressed, Hopeless 0 0 0 0  PHQ - 2 Score 0 0 0 0   Functional Status Survey: Is the patient deaf or have difficulty  hearing?: No Does the patient have difficulty seeing, even when wearing glasses/contacts?: No Does the patient have difficulty concentrating, remembering, or making decisions?: No Does the patient have difficulty walking or climbing stairs?: No Does the patient have difficulty dressing or bathing?: No Does the patient have difficulty doing errands alone such as visiting a doctor's office or shopping?: No  Mental Status Screening 6CIT Screen 02/10/2018  What Year? 0 points  What month? 0 points  What time? 0 points  Count back from 20 0 points  Months in reverse 0 points  Repeat phrase 4 points  Total Score 4     Visual Acuity Screening   Right eye Left eye Both eyes  Without correction:     With correction: 20/20-1 20/20-1 20/15   Vision: seen annually; mandatory for work Dentist: recommended last yr, planned on scheduling Exercise: at work only, has a physical job  Pt plans to retire from [employer] at 63 and 2 months.   Patient Active Problem List   Diagnosis Date Noted  . Family history of diabetes mellitus 01/24/2015   Past Medical History:  Diagnosis Date  . Allergy    Past Surgical History:  Procedure Laterality Date  . ABDOMINAL HYSTERECTOMY     No Known Allergies Prior to Admission medications   Not on File   Social History   Socioeconomic History  . Marital status: Single  Spouse name: Not on file  . Number of children: Not on file  . Years of education: Not on file  . Highest education level: Not on file  Occupational History  . Not on file  Social Needs  . Financial resource strain: Not on file  . Food insecurity:    Worry: Not on file    Inability: Not on file  . Transportation needs:    Medical: Not on file    Non-medical: Not on file  Tobacco Use  . Smoking status: Never Smoker  . Smokeless tobacco: Never Used  Substance and Sexual Activity  . Alcohol use: No  . Drug use: No  . Sexual activity: Not on file  Lifestyle  . Physical  activity:    Days per week: Not on file    Minutes per session: Not on file  . Stress: Not on file  Relationships  . Social connections:    Talks on phone: Not on file    Gets together: Not on file    Attends religious service: Not on file    Active member of club or organization: Not on file    Attends meetings of clubs or organizations: Not on file    Relationship status: Not on file  . Intimate partner violence:    Fear of current or ex partner: Not on file    Emotionally abused: Not on file    Physically abused: Not on file    Forced sexual activity: Not on file  Other Topics Concern  . Not on file  Social History Narrative  . Not on file   Review of Systems  All other systems reviewed and are negative. 13 point ROS, neg    Objective:   Physical Exam  Constitutional: She is oriented to person, place, and time. She appears well-developed and well-nourished.  HENT:  Head: Normocephalic and atraumatic.  Right Ear: External ear normal.  Left Ear: External ear normal.  Mouth/Throat: Oropharynx is clear and moist.  Eyes: Pupils are equal, round, and reactive to light. Conjunctivae are normal.  Neck: Normal range of motion. Neck supple. No thyromegaly present.  Cardiovascular: Normal rate, regular rhythm, normal heart sounds and intact distal pulses.  No murmur heard. Pulmonary/Chest: Effort normal and breath sounds normal. No respiratory distress. She has no wheezes.  Abdominal: Soft. Bowel sounds are normal. There is no tenderness.  Musculoskeletal: Normal range of motion. She exhibits no edema or tenderness.  Lymphadenopathy:    She has no cervical adenopathy.  Neurological: She is alert and oriented to person, place, and time.  Skin: Skin is warm and dry. No rash noted.  Psychiatric: She has a normal mood and affect. Her behavior is normal. Thought content normal.     Vitals:   02/10/18 1546 02/10/18 1549  BP: (!) 152/71 128/78  Pulse: 64   Temp: 98.4 F (36.9 C)    TempSrc: Oral   SpO2: 98%   Weight: 109 lb 3.2 oz (49.5 kg)   Height: 5\' 3"  (1.6 m)       Assessment & Plan:  Tamara PaxCarolyn Haley is a 62 y.o. female Annual physical exam  - -anticipatory guidance as below in AVS, screening labs above. Health maintenance items as above in HPI discussed/recommended as applicable.   - recommended flu and shingles vaccine - declined.   Hyperlipidemia, unspecified hyperlipidemia type - Plan: Lipid panel  - check lipids. Based on very good levlel of HDL would not recommend meds at present.   Family  history of diabetes mellitus - Plan: Comprehensive metabolic panel  Screening for breast cancer - Plan: MM 3D SCREEN BREAST BILATERAL, CANCELED: MM Digital Screening  - importance of screening discussed - agreed to have this done.   Special screening for malignant neoplasms, colon - Plan: Cologuard  - options discussed on screening. ultimately chose cologuard. Potential need for colonoscopy discussed.   No orders of the defined types were placed in this encounter.  Patient Instructions    I recommend flu shot, and shingles vaccines. Let me know if you change your mind about these vaccines.   I will order Cologuard for colon cancer screening.   I will order the mammogram for breast cancer screening.  Keep follow up with dentist, and eye doctor.   Thank you for coming in today.   Keeping You Healthy  Get These Tests  Blood Pressure- Have your blood pressure checked by your healthcare provider at least once a year.  Normal blood pressure is 120/80.  Weight- Have your body mass index (BMI) calculated to screen for obesity.  BMI is a measure of body fat based on height and weight.  You can calculate your own BMI at https://www.west-esparza.com/  Cholesterol- Have your cholesterol checked every year.  Diabetes- Have your blood sugar checked every year if you have high blood pressure, high cholesterol, a family history of diabetes or if you are  overweight.  Pap Test - Have a pap test every 1 to 5 years if you have been sexually active.  If you are older than 65 and recent pap tests have been normal you may not need additional pap tests.  In addition, if you have had a hysterectomy  for benign disease additional pap tests are not necessary.  Mammogram-Yearly mammograms are essential for early detection of breast cancer  Screening for Colon Cancer- Colonoscopy starting at age 36. Screening may begin sooner depending on your family history and other health conditions.  Follow up colonoscopy as directed by your Gastroenterologist.  Screening for Osteoporosis- Screening begins at age 17 with bone density scanning, sooner if you are at higher risk for developing Osteoporosis.  Get these medicines  Calcium with Vitamin D- Your body requires 1200-1500 mg of Calcium a day and [phone] IU of Vitamin D a day.  You can only absorb 500 mg of Calcium at a time therefore Calcium must be taken in 2 or 3 separate doses throughout the day.  Hormones- Hormone therapy has been associated with increased risk for certain cancers and heart disease.  Talk to your healthcare provider about if you need relief from menopausal symptoms.  Aspirin- Ask your healthcare provider about taking Aspirin to prevent Heart Disease and Stroke.  Get these Immuniztions  Flu shot- Every fall  Pneumonia shot- Once after the age of 27; if you are younger ask your healthcare provider if you need a pneumonia shot.  Tetanus- Every ten years.  Zostavax- Once after the age of 20 to prevent shingles.  Take these steps  Don't smoke- Your healthcare provider can help you quit. For tips on how to quit, ask your healthcare provider or go to www.smokefree.gov or call 1-800 QUIT-NOW.  Be physically active- Exercise 5 days a week for a minimum of 30 minutes.  If you are not already physically active, start slow and gradually work up to 30 minutes of moderate physical activity.  Try  walking, dancing, bike riding, swimming, etc.  Eat a healthy diet- Eat a variety of healthy foods such as  fruits, vegetables, whole grains, low fat milk, low fat cheeses, yogurt, lean meats, chicken, fish, eggs, dried beans, tofu, etc.  For more information go to www.thenutritionsource.org  Dental visit- Brush and floss teeth twice daily; visit your dentist twice a year.  Eye exam- Visit your Optometrist or Ophthalmologist yearly.  Drink alcohol in moderation- Limit alcohol intake to one drink or less a day.  Never drink and drive.  Depression- Your emotional health is as important as your physical health.  If you're feeling down or losing interest in things you normally enjoy, please talk to your healthcare provider.  Seat Belts- can save your life; always wear one  Smoke/Carbon Monoxide detectors- These detectors need to be installed on the appropriate level of your home.  Replace batteries at least once a year.  Violence- If anyone is threatening or hurting you, please tell your healthcare provider.  Living Will/ Health care power of attorney- Discuss with your healthcare provider and family.   If you have lab work done today you will be contacted with your lab results within the next 2 weeks.  If you have not heard from [place] then please contact us. The fastest way to get your results is to register for My Chart.   IF you received an x-ray today, you will receive an invoice from [facility]. Please contact [facility] at [phone] with questions or concerns regarding your invoice.   IF you received labwork today, you will receive an invoice from [facility]. Please contact [facility] at [phone] with questions or concerns regarding your invoice.   Our billing staff will not be able to assist you with questions regarding bills from these companies.  You will be contacted with the lab results as soon as they are available. The fastest way to get your results is to  activate your My Chart account. Instructions are located on the last page of this paperwork. If you have not heard from [place] regarding the results in 2 weeks, please contact this office.       I personally performed the services described in this documentation, which was scribed in my presence. The recorded information has been reviewed and considered for accuracy and completeness, addended by me as needed, and agree with information above.  Signed,   Meredith Staggers, MD Primary Care at [facility].  02/12/18 10:30 AM

## 2018-02-10 NOTE — Patient Instructions (Addendum)
I recommend flu shot, and shingles vaccines. Let me know if you change your mind about these vaccines.   I will order Cologuard for colon cancer screening.   I will order the mammogram for breast cancer screening.  Keep follow up with dentist, and eye doctor.   Thank you for coming in today.   Keeping You Healthy  Get These Tests  Blood Pressure- Have your blood pressure checked by your healthcare provider at least once a year.  Normal blood pressure is 120/80.  Weight- Have your body mass index (BMI) calculated to screen for obesity.  BMI is a measure of body fat based on height and weight.  You can calculate your own BMI at https://www.west-esparza.com/www.nhlbisupport.com/bmi/  Cholesterol- Have your cholesterol checked every year.  Diabetes- Have your blood sugar checked every year if you have high blood pressure, high cholesterol, a family history of diabetes or if you are overweight.  Pap Test - Have a pap test every 1 to 5 years if you have been sexually active.  If you are older than 65 and recent pap tests have been normal you may not need additional pap tests.  In addition, if you have had a hysterectomy  for benign disease additional pap tests are not necessary.  Mammogram-Yearly mammograms are essential for early detection of breast cancer  Screening for Colon Cancer- Colonoscopy starting at age 64. Screening may begin sooner depending on your family history and other health conditions.  Follow up colonoscopy as directed by your Gastroenterologist.  Screening for Osteoporosis- Screening begins at age 565 with bone density scanning, sooner if you are at higher risk for developing Osteoporosis.  Get these medicines  Calcium with Vitamin D- Your body requires 1200-1500 mg of Calcium a day and [phone] IU of Vitamin D a day.  You can only absorb 500 mg of Calcium at a time therefore Calcium must be taken in 2 or 3 separate doses throughout the day.  Hormones- Hormone therapy has been associated with  increased risk for certain cancers and heart disease.  Talk to your healthcare provider about if you need relief from menopausal symptoms.  Aspirin- Ask your healthcare provider about taking Aspirin to prevent Heart Disease and Stroke.  Get these Immuniztions  Flu shot- Every fall  Pneumonia shot- Once after the age of 64; if you are younger ask your healthcare provider if you need a pneumonia shot.  Tetanus- Every ten years.  Zostavax- Once after the age of 64 to prevent shingles.  Take these steps  Don't smoke- Your healthcare provider can help you quit. For tips on how to quit, ask your healthcare provider or go to www.smokefree.gov or call 1-800 QUIT-NOW.  Be physically active- Exercise 5 days a week for a minimum of 30 minutes.  If you are not already physically active, start slow and gradually work up to 30 minutes of moderate physical activity.  Try walking, dancing, bike riding, swimming, etc.  Eat a healthy diet- Eat a variety of healthy foods such as fruits, vegetables, whole grains, low fat milk, low fat cheeses, yogurt, lean meats, chicken, fish, eggs, dried beans, tofu, etc.  For more information go to www.thenutritionsource.org  Dental visit- Brush and floss teeth twice daily; visit your dentist twice a year.  Eye exam- Visit your Optometrist or Ophthalmologist yearly.  Drink alcohol in moderation- Limit alcohol intake to one drink or less a day.  Never drink and drive.  Depression- Your emotional health is as important as your physical  health.  If you're feeling down or losing interest in things you normally enjoy, please talk to your healthcare provider.  Seat Belts- can save your life; always wear one  Smoke/Carbon Monoxide detectors- These detectors need to be installed on the appropriate level of your home.  Replace batteries at least once a year.  Violence- If anyone is threatening or hurting you, please tell your healthcare provider.  Living Will/ Health care  power of attorney- Discuss with your healthcare provider and family.   If you have lab work done today you will be contacted with your lab results within the next 2 weeks.  If you have not heard from [place] then please contact us. The fastest way to get your results is to register for My Chart.   IF you received an x-ray today, you will receive an invoice from [facility]. Please contact [facility] at [phone] with questions or concerns regarding your invoice.   IF you received labwork today, you will receive an invoice from [facility]. Please contact [facility] at [phone] with questions or concerns regarding your invoice.   Our billing staff will not be able to assist you with questions regarding bills from these companies.  You will be contacted with the lab results as soon as they are available. The fastest way to get your results is to activate your My Chart account. Instructions are located on the last page of this paperwork. If you have not heard from [place] regarding the results in 2 weeks, please contact this office.

## 2018-02-11 LAB — COMPREHENSIVE METABOLIC PANEL
A/G RATIO: 1.6 (ref 1.2–2.2)
ALT: 14 IU/L (ref 0–32)
AST: 16 IU/L (ref 0–40)
Albumin: 4.4 g/dL (ref 3.6–4.8)
Alkaline Phosphatase: 90 IU/L (ref 39–117)
BUN/Creatinine Ratio: 19 (ref 12–28)
BUN: 12 mg/dL (ref 8–27)
Bilirubin Total: 0.7 mg/dL (ref 0.0–1.2)
CALCIUM: 9.7 mg/dL (ref 8.7–10.3)
CO2: 25 mmol/L (ref 20–29)
Chloride: 104 mmol/L (ref 96–106)
Creatinine, Ser: 0.63 mg/dL (ref 0.57–1.00)
GFR, EST AFRICAN AMERICAN: 110 mL/min/{1.73_m2} (ref >59)
GFR, EST NON AFRICAN AMERICAN: 95 mL/min/{1.73_m2} (ref >59)
GLOBULIN, TOTAL: 2.8 g/dL (ref 1.5–4.5)
Glucose: 92 mg/dL (ref 65–99)
POTASSIUM: 4.3 mmol/L (ref 3.5–5.2)
SODIUM: 143 mmol/L (ref 134–144)
TOTAL PROTEIN: 7.2 g/dL (ref 6.0–8.5)

## 2018-02-11 LAB — LIPID PANEL
CHOL/HDL RATIO: 2.4 ratio (ref 0.0–4.4)
Cholesterol, Total: 222 mg/dL — ABNORMAL HIGH (ref 100–199)
HDL: 92 mg/dL (ref >39)
LDL Calculated: 118 mg/dL — ABNORMAL HIGH (ref 0–99)
Triglycerides: 60 mg/dL (ref 0–149)
VLDL Cholesterol Cal: 12 mg/dL (ref 5–40)

## 2018-03-10 ENCOUNTER — Ambulatory Visit
Admission: RE | Admit: 2018-03-10 | Discharge: 2018-03-10 | Disposition: A | Discharge status: Home / Self Care | Payer: 59 | Source: Ambulatory Visit | Attending: Family Medicine | Admitting: Family Medicine

## 2018-03-10 DIAGNOSIS — Z1239 Encounter for other screening for malignant neoplasm of breast: Secondary | ICD-10-CM

## 2018-03-12 ENCOUNTER — Other Ambulatory Visit: Payer: Self-pay | Admitting: Family Medicine

## 2018-03-12 DIAGNOSIS — R928 Other abnormal and inconclusive findings on diagnostic imaging of breast: Secondary | ICD-10-CM

## 2018-03-21 ENCOUNTER — Other Ambulatory Visit: Payer: Self-pay | Admitting: Family Medicine

## 2018-03-21 ENCOUNTER — Ambulatory Visit
Admission: RE | Admit: 2018-03-21 | Discharge: 2018-03-21 | Disposition: A | Discharge status: Home / Self Care | Payer: 59 | Source: Ambulatory Visit | Attending: Family Medicine | Admitting: Family Medicine

## 2018-03-21 DIAGNOSIS — R928 Other abnormal and inconclusive findings on diagnostic imaging of breast: Secondary | ICD-10-CM

## 2018-03-21 DIAGNOSIS — N6489 Other specified disorders of breast: Secondary | ICD-10-CM

## 2018-03-21 LAB — COLOGUARD: Cologuard: NEGATIVE

## 2018-09-22 ENCOUNTER — Other Ambulatory Visit: Payer: Self-pay | Admitting: Family Medicine

## 2018-09-22 ENCOUNTER — Ambulatory Visit: Payer: 59

## 2018-09-22 ENCOUNTER — Other Ambulatory Visit: Payer: 59

## 2018-09-22 ENCOUNTER — Ambulatory Visit
Admission: RE | Admit: 2018-09-22 | Discharge: 2018-09-22 | Disposition: A | Discharge status: Home / Self Care | Payer: 59 | Source: Ambulatory Visit | Attending: Family Medicine | Admitting: Family Medicine

## 2018-09-22 ENCOUNTER — Other Ambulatory Visit: Payer: Self-pay

## 2018-09-22 DIAGNOSIS — N6489 Other specified disorders of breast: Secondary | ICD-10-CM

## 2018-12-24 ENCOUNTER — Other Ambulatory Visit (HOSPITAL_COMMUNITY)
Admission: RE | Admit: 2018-12-24 | Discharge: 2018-12-24 | Disposition: A | Discharge status: Home / Self Care | Payer: 59 | Source: Skilled Nursing Facility | Attending: Adult Health | Admitting: Adult Health

## 2018-12-24 DIAGNOSIS — I10 Essential (primary) hypertension: Secondary | ICD-10-CM | POA: Insufficient documentation

## 2018-12-24 LAB — CBC

## 2018-12-24 LAB — COMPREHENSIVE METABOLIC PANEL

## 2019-03-11 ENCOUNTER — Other Ambulatory Visit: Payer: Self-pay

## 2019-03-11 ENCOUNTER — Ambulatory Visit (INDEPENDENT_AMBULATORY_CARE_PROVIDER_SITE_OTHER): Payer: PRIVATE HEALTH INSURANCE | Admitting: Family Medicine

## 2019-03-11 ENCOUNTER — Encounter: Payer: Self-pay | Admitting: Family Medicine

## 2019-03-11 VITALS — BP 134/69 | HR 65 | Temp 98.8°F | Wt 110.6 lb

## 2019-03-11 DIAGNOSIS — Z0000 Encounter for general adult medical examination without abnormal findings: Secondary | ICD-10-CM

## 2019-03-11 DIAGNOSIS — E785 Hyperlipidemia, unspecified: Secondary | ICD-10-CM

## 2019-03-11 DIAGNOSIS — Z0001 Encounter for general adult medical examination with abnormal findings: Secondary | ICD-10-CM | POA: Diagnosis not present

## 2019-03-11 DIAGNOSIS — Z131 Encounter for screening for diabetes mellitus: Secondary | ICD-10-CM | POA: Diagnosis not present

## 2019-03-11 NOTE — Patient Instructions (Addendum)
I do recommend flu vaccine, pneumonia vaccine, shingles vaccine as well as bone density testing to screen for osteoporosis. Let me know if you change your mind.   Please schedule dentist visit.  Thanks for coming in today and take care.   Preventive Care 19 Years and Older, Female Preventive care refers to lifestyle choices and visits with your health care provider that can promote health and wellness. This includes:  A yearly physical exam. This is also called an annual well check.  Regular dental and eye exams.  Immunizations.  Screening for certain conditions.  Healthy lifestyle choices, such as diet and exercise. What can I expect for my preventive care visit? Physical exam Your health care provider will check:  Height and weight. These may be used to calculate body mass index (BMI), which is a measurement that tells if you are at a healthy weight.  Heart rate and blood pressure.  Your skin for abnormal spots. Counseling Your health care provider may ask you questions about:  Alcohol, tobacco, and drug use.  Emotional well-being.  Home and relationship well-being.  Sexual activity.  Eating habits.  History of falls.  Memory and ability to understand (cognition).  Work and work Statistician.  Pregnancy and menstrual history. What immunizations do I need?  Influenza (flu) vaccine  This is recommended every year. Tetanus, diphtheria, and pertussis (Tdap) vaccine  You may need a Td booster every 10 years. Varicella (chickenpox) vaccine  You may need this vaccine if you have not already been vaccinated. Zoster (shingles) vaccine  You may need this after age 65. Pneumococcal conjugate (PCV13) vaccine  One dose is recommended after age 65. Pneumococcal polysaccharide (PPSV23) vaccine  One dose is recommended after age 21. Measles, mumps, and rubella (MMR) vaccine  You may need at least one dose of MMR if you were born in 1957 or later. You may also need  a second dose. Meningococcal conjugate (MenACWY) vaccine  You may need this if you have certain conditions. Hepatitis A vaccine  You may need this if you have certain conditions or if you travel or work in places where you may be exposed to hepatitis A. Hepatitis B vaccine  You may need this if you have certain conditions or if you travel or work in places where you may be exposed to hepatitis B. Haemophilus influenzae type b (Hib) vaccine  You may need this if you have certain conditions. You may receive vaccines as individual doses or as more than one vaccine together in one shot (combination vaccines). Talk with your health care provider about the risks and benefits of combination vaccines. What tests do I need? Blood tests  Lipid and cholesterol levels. These may be checked every 5 years, or more frequently depending on your overall health.  Hepatitis C test.  Hepatitis B test. Screening  Lung cancer screening. You may have this screening every year starting at age 65 if you have a 30-pack-year history of smoking and currently smoke or have quit within the past 15 years.  Colorectal cancer screening. All adults should have this screening starting at age 65 and continuing until age 43. Your health care provider may recommend screening at age 2 if you are at increased risk. You will have tests every 1-10 years, depending on your results and the type of screening test.  Diabetes screening. This is done by checking your blood sugar (glucose) after you have not eaten for a while (fasting). You may have this done every 1-3 years.  Mammogram. This may be done every 1-2 years. Talk with your health care provider about how often you should have regular mammograms.  BRCA-related cancer screening. This may be done if you have a family history of breast, ovarian, tubal, or peritoneal cancers. Other tests  Sexually transmitted disease (STD) testing.  Bone density scan. This is done to  screen for osteoporosis. You may have this done starting at age 65. Follow these instructions at home: Eating and drinking  Eat a diet that includes fresh fruits and vegetables, whole grains, lean protein, and low-fat dairy products. Limit your intake of foods with high amounts of sugar, saturated fats, and salt.  Take vitamin and mineral supplements as recommended by your health care provider.  Do not drink alcohol if your health care provider tells you not to drink.  If you drink alcohol: ? Limit how much you have to 0-1 drink a day. ? Be aware of how much alcohol is in your drink. In the [place] one drink equals one 12 oz bottle of beer (355 mL), one 5 oz glass of wine (148 mL), or one 1 oz glass of hard liquor (44 mL). Lifestyle  Take daily care of your teeth and gums.  Stay active. Exercise for at least 30 minutes on 5 or more days each week.  Do not use any products that contain nicotine or tobacco, such as cigarettes, e-cigarettes, and chewing tobacco. If you need help quitting, ask your health care provider.  If you are sexually active, practice safe sex. Use a condom or other form of protection in order to prevent STIs (sexually transmitted infections).  Talk with your health care provider about taking a low-dose aspirin or statin. What's next?  Go to your health care provider once a year for a well check visit.  Ask your health care provider how often you should have your eyes and teeth checked.  Stay up to date on all vaccines. This information is not intended to replace advice given to you by your health care provider. Make sure you discuss any questions you have with your health care provider. Document Released: 07/01/2015 Document Revised: 05/29/2018 Document Reviewed: 05/29/2018 Elsevier Patient Education  [employer].   If you have lab work done today you will be contacted with your lab results within the next 2 weeks.  If you have not heard from [place] then  please contact us. The fastest way to get your results is to register for My Chart.   IF you received an x-ray today, you will receive an invoice from [facility]. Please contact [facility] at [phone] with questions or concerns regarding your invoice.   IF you received labwork today, you will receive an invoice from [facility]. Please contact [facility] at [phone] with questions or concerns regarding your invoice.   Our billing staff will not be able to assist you with questions regarding bills from these companies.  You will be contacted with the lab results as soon as they are available. The fastest way to get your results is to activate your My Chart account. Instructions are located on the last page of this paperwork. If you have not heard from [place] regarding the results in 2 weeks, please contact this office.

## 2019-03-11 NOTE — Progress Notes (Signed)
Subjective:    Patient ID: Tamara Cole, female    DOB: [birth year], 63 y.o.   MRN: [id number]  HPI Tamara Cole is a 63 y.o. female Presents today for: Chief Complaint  Patient presents with  . Annual Exam    Here for annual exam with no issues at this time   Care team: PCP: me Optho: vision works [facility] [facility].   Hyperlipidemia: LDL improved from [phone] in August of last year.  HDL has been very good, with low ASCVD risk score.  No current meds. Lab Results  Component Value Date   CHOL 222 (H) 02/10/2018   HDL 92 02/10/2018   LDLCALC 118 (H) 02/10/2018   TRIG 60 02/10/2018   CHOLHDL 2.4 02/10/2018   The 10-year ASCVD risk score Mikey Bussing DC Jr., et al., 2013) is: 5.2%   Values used to calculate the score:     Age: 47 years     Sex: Female     Is Non-Hispanic African American: No     Diabetic: No     Tobacco smoker: No     Systolic Blood Pressure: 413 mmHg     Is BP treated: No     HDL Cholesterol: 92 mg/dL     Total Cholesterol: 222 mg/dL  Cancer screening: Cologuard 03/20/2018 Mammogram 03/10/2018 - has appt tomorrow. 6 mo follow up, stable probably benign asymmetry in R breast MLO projection.   Declines bone density screening, declines HIV screening test.     There is no immunization history on file for this patient.  On chart review, appears to have had Tdap in September 2012. Flu vaccine: had at job, "fell out" next day. Declines repeat flu vaccine Pneumonia vaccine: refuses.  Shingles vaccine: refuses.   Fall screening: No falls in the past year, no loose rugs, adequate lighting at home.  Stairs: none.   Depression screen [facility] 2/9 03/11/2019 02/10/2018 02/23/2017 01/19/2016 01/24/2015  Decreased Interest 0 0 0 0 0  Down, Depressed, Hopeless 0 0 0 0 0  PHQ - 2 Score 0 0 0 0 0    Functional Status Survey: Is the patient deaf or have difficulty hearing?: No Does the patient have difficulty seeing, even when wearing glasses/contacts?: No  Does the patient have difficulty concentrating, remembering, or making decisions?: No Does the patient have difficulty walking or climbing stairs?: No Does the patient have difficulty dressing or bathing?: No Does the patient have difficulty doing errands alone such as visiting a doctor's office or shopping?: No  6CIT Screen 03/11/2019 02/10/2018  What Year? 0 points 0 points  What month? 0 points 0 points  What time? 0 points 0 points  Count back from 20 0 points 0 points  Months in reverse 4 points 0 points  Repeat phrase 0 points 4 points  Total Score 4 4   Stable memory screen from last year.   Hearing Screening   _0  _1  _2  _3  _4  _5  _6  _7  _8   Right ear:           Left ear:             Visual Acuity Screening   Right eye Left eye Both eyes  Without correction: _9  With correction:     eye care provider: yearly .   Dental: prior provider had dementia - has to schedule new appointment.   Activity/exercise: exercise with work at [employer].   Advanced directives: Has healthcare power of attorney and living well,  scanned in chart.  Alcohol/tobacco; does not use either.      Patient Active Problem List   Diagnosis Date Noted  . Family history of diabetes mellitus 01/24/2015   Past Medical History:  Diagnosis Date  . Allergy    Past Surgical History:  Procedure Laterality Date  . ABDOMINAL HYSTERECTOMY     No Known Allergies Prior to Admission medications   Not on File   Social History   Socioeconomic History  . Marital status: Single    Spouse name: Not on file  . Number of children: Not on file  . Years of education: Not on file  . Highest education level: Not on file  Occupational History  . Not on file  Social Needs  . Financial resource strain: Not on file  . Food insecurity    Worry: Not on file    Inability: Not on file  . Transportation needs    Medical: Not on file    Non-medical: Not  on file  Tobacco Use  . Smoking status: Never Smoker  . Smokeless tobacco: Never Used  Substance and Sexual Activity  . Alcohol use: No  . Drug use: No  . Sexual activity: Not on file  Lifestyle  . Physical activity    Days per week: Not on file    Minutes per session: Not on file  . Stress: Not on file  Relationships  . Social Herbalist on phone: Not on file    Gets together: Not on file    Attends religious service: Not on file    Active member of club or organization: Not on file    Attends meetings of clubs or organizations: Not on file    Relationship status: Not on file  . Intimate partner violence    Fear of current or ex partner: Not on file    Emotionally abused: Not on file    Physically abused: Not on file    Forced sexual activity: Not on file  Other Topics Concern  . Not on file  Social History Narrative  . Not on file    Review of Systems 13 point review of systems per patient health survey noted.  Negative other than as indicated above or in HPI.      Objective:   Physical Exam Vitals signs reviewed.  Constitutional:      Appearance: She is well-developed.  HENT:     Head: Normocephalic and atraumatic.     Right Ear: External ear normal.     Left Ear: External ear normal.  Eyes:     Conjunctiva/sclera: Conjunctivae normal.     Pupils: Pupils are equal, round, and reactive to light.  Neck:     Musculoskeletal: Normal range of motion and neck supple.     Thyroid: No thyromegaly.  Cardiovascular:     Rate and Rhythm: Normal rate and regular rhythm.     Heart sounds: Normal heart sounds. No murmur.  Pulmonary:     Effort: Pulmonary effort is normal. No respiratory distress.     Breath sounds: Normal breath sounds. No wheezing.  Abdominal:     General: Bowel sounds are normal.     Palpations: Abdomen is soft.     Tenderness: There is no abdominal tenderness.  Musculoskeletal: Normal range of motion.        General: No tenderness.   Lymphadenopathy:     Cervical: No cervical adenopathy.  Skin:    General: Skin is warm  and dry.     Findings: No rash.  Neurological:     Mental Status: She is alert and oriented to person, place, and time.  Psychiatric:        Behavior: Behavior normal.        Thought Content: Thought content normal.    Vitals:   03/11/19 1313  BP: 134/69  Pulse: 65  Temp: 98.8 F (37.1 C)  TempSrc: Oral  SpO2: 98%  Weight: 110 lb 9.6 oz (50.2 kg)       Assessment & Plan:   Tamara Cole is a 63 y.o. female Annual physical exam  - -anticipatory guidance as below in AVS, screening labs above. Health maintenance items as above in HPI discussed/recommended as applicable.   -Recommended flu, pneumonia, shingles vaccine but declined at this time.  Also declined bone density screening.  Screening for diabetes mellitus - Plan: Comprehensive metabolic panel  Hyperlipidemia, unspecified hyperlipidemia type - Plan: Lipid Panel  -Mild elevation previously but great HDL.  Repeat labs.  No orders of the defined types were placed in this encounter.  Patient Instructions   I do recommend flu vaccine, pneumonia vaccine, shingles vaccine as well as bone density testing to screen for osteoporosis. Let me know if you change your mind.   Please schedule dentist visit.  Thanks for coming in today and take care.   Preventive Care 70 Years and Older, Female Preventive care refers to lifestyle choices and visits with your health care provider that can promote health and wellness. This includes:  A yearly physical exam. This is also called an annual well check.  Regular dental and eye exams.  Immunizations.  Screening for certain conditions.  Healthy lifestyle choices, such as diet and exercise. What can I expect for my preventive care visit? Physical exam Your health care provider will check:  Height and weight. These may be used to calculate body mass index (BMI), which is a measurement  that tells if you are at a healthy weight.  Heart rate and blood pressure.  Your skin for abnormal spots. Counseling Your health care provider may ask you questions about:  Alcohol, tobacco, and drug use.  Emotional well-being.  Home and relationship well-being.  Sexual activity.  Eating habits.  History of falls.  Memory and ability to understand (cognition).  Work and work Statistician.  Pregnancy and menstrual history. What immunizations do I need?  Influenza (flu) vaccine  This is recommended every year. Tetanus, diphtheria, and pertussis (Tdap) vaccine  You may need a Td booster every 10 years. Varicella (chickenpox) vaccine  You may need this vaccine if you have not already been vaccinated. Zoster (shingles) vaccine  You may need this after age 24. Pneumococcal conjugate (PCV13) vaccine  One dose is recommended after age 18. Pneumococcal polysaccharide (PPSV23) vaccine  One dose is recommended after age 39. Measles, mumps, and rubella (MMR) vaccine  You may need at least one dose of MMR if you were born in 1957 or later. You may also need a second dose. Meningococcal conjugate (MenACWY) vaccine  You may need this if you have certain conditions. Hepatitis A vaccine  You may need this if you have certain conditions or if you travel or work in places where you may be exposed to hepatitis A. Hepatitis B vaccine  You may need this if you have certain conditions or if you travel or work in places where you may be exposed to hepatitis B. Haemophilus influenzae type b (Hib) vaccine  You may  need this if you have certain conditions. You may receive vaccines as individual doses or as more than one vaccine together in one shot (combination vaccines). Talk with your health care provider about the risks and benefits of combination vaccines. What tests do I need? Blood tests  Lipid and cholesterol levels. These may be checked every 5 years, or more frequently  depending on your overall health.  Hepatitis C test.  Hepatitis B test. Screening  Lung cancer screening. You may have this screening every year starting at age 8 if you have a 30-pack-year history of smoking and currently smoke or have quit within the past 15 years.  Colorectal cancer screening. All adults should have this screening starting at age 80 and continuing until age 59. Your health care provider may recommend screening at age 45 if you are at increased risk. You will have tests every 1-10 years, depending on your results and the type of screening test.  Diabetes screening. This is done by checking your blood sugar (glucose) after you have not eaten for a while (fasting). You may have this done every 1-3 years.  Mammogram. This may be done every 1-2 years. Talk with your health care provider about how often you should have regular mammograms.  BRCA-related cancer screening. This may be done if you have a family history of breast, ovarian, tubal, or peritoneal cancers. Other tests  Sexually transmitted disease (STD) testing.  Bone density scan. This is done to screen for osteoporosis. You may have this done starting at age 55. Follow these instructions at home: Eating and drinking  Eat a diet that includes fresh fruits and vegetables, whole grains, lean protein, and low-fat dairy products. Limit your intake of foods with high amounts of sugar, saturated fats, and salt.  Take vitamin and mineral supplements as recommended by your health care provider.  Do not drink alcohol if your health care provider tells you not to drink.  If you drink alcohol: ? Limit how much you have to 0-1 drink a day. ? Be aware of how much alcohol is in your drink. In the [place] one drink equals one 12 oz bottle of beer (355 mL), one 5 oz glass of wine (148 mL), or one 1 oz glass of hard liquor (44 mL). Lifestyle  Take daily care of your teeth and gums.  Stay active. Exercise for at least 30  minutes on 5 or more days each week.  Do not use any products that contain nicotine or tobacco, such as cigarettes, e-cigarettes, and chewing tobacco. If you need help quitting, ask your health care provider.  If you are sexually active, practice safe sex. Use a condom or other form of protection in order to prevent STIs (sexually transmitted infections).  Talk with your health care provider about taking a low-dose aspirin or statin. What's next?  Go to your health care provider once a year for a well check visit.  Ask your health care provider how often you should have your eyes and teeth checked.  Stay up to date on all vaccines. This information is not intended to replace advice given to you by your health care provider. Make sure you discuss any questions you have with your health care provider. Document Released: 07/01/2015 Document Revised: 05/29/2018 Document Reviewed: 05/29/2018 Elsevier Patient Education  [employer].   If you have lab work done today you will be contacted with your lab results within the next 2 weeks.  If you have not heard from  [place] then please contact us. The fastest way to get your results is to register for My Chart.   IF you received an x-ray today, you will receive an invoice from [facility]. Please contact [facility] at [phone] with questions or concerns regarding your invoice.   IF you received labwork today, you will receive an invoice from [place]. Please contact [facility] at [phone] with questions or concerns regarding your invoice.   Our billing staff will not be able to assist you with questions regarding bills from these companies.  You will be contacted with the lab results as soon as they are available. The fastest way to get your results is to activate your My Chart account. Instructions are located on the last page of this paperwork. If you have not heard from [place] regarding the results in 2 weeks, please  contact this office.       Signed,   Merri Ray, MD [facility].  03/11/19 10:08 PM

## 2019-03-12 ENCOUNTER — Ambulatory Visit
Admission: RE | Admit: 2019-03-12 | Discharge: 2019-03-12 | Disposition: A | Discharge status: Home / Self Care | Payer: 59 | Source: Ambulatory Visit | Attending: Family Medicine | Admitting: Family Medicine

## 2019-03-12 DIAGNOSIS — N6489 Other specified disorders of breast: Secondary | ICD-10-CM

## 2019-03-12 LAB — COMPREHENSIVE METABOLIC PANEL
ALT: 15 IU/L (ref 0–32)
AST: 24 IU/L (ref 0–40)
Albumin/Globulin Ratio: 1.4 (ref 1.2–2.2)
Albumin: 4.3 g/dL (ref 3.8–4.8)
Alkaline Phosphatase: 97 IU/L (ref 39–117)
BUN/Creatinine Ratio: 20 (ref 12–28)
BUN: 13 mg/dL (ref 8–27)
Bilirubin Total: 0.4 mg/dL (ref 0.0–1.2)
CO2: 24 mmol/L (ref 20–29)
Calcium: 9.3 mg/dL (ref 8.7–10.3)
Chloride: 101 mmol/L (ref 96–106)
Creatinine, Ser: 0.64 mg/dL (ref 0.57–1.00)
GFR calc Af Amer: 108 mL/min/{1.73_m2} (ref >59)
GFR calc non Af Amer: 94 mL/min/{1.73_m2} (ref >59)
Globulin, Total: 3.1 g/dL (ref 1.5–4.5)
Glucose: 86 mg/dL (ref 65–99)
Potassium: 4.3 mmol/L (ref 3.5–5.2)
Sodium: 138 mmol/L (ref 134–144)
Total Protein: 7.4 g/dL (ref 6.0–8.5)

## 2019-03-12 LAB — LIPID PANEL
Chol/HDL Ratio: 2.8 ratio (ref 0.0–4.4)
Cholesterol, Total: 261 mg/dL — ABNORMAL HIGH (ref 100–199)
HDL: 93 mg/dL (ref >39)
LDL Chol Calc (NIH): 149 mg/dL — ABNORMAL HIGH (ref 0–99)
Triglycerides: 113 mg/dL (ref 0–149)
VLDL Cholesterol Cal: 19 mg/dL (ref 5–40)

## 2019-03-19 ENCOUNTER — Encounter: Payer: Self-pay | Admitting: Radiology

## 2019-04-17 ENCOUNTER — Ambulatory Visit (INDEPENDENT_AMBULATORY_CARE_PROVIDER_SITE_OTHER): Payer: PRIVATE HEALTH INSURANCE | Admitting: Family Medicine

## 2019-04-17 ENCOUNTER — Encounter: Payer: Self-pay | Admitting: Family Medicine

## 2019-04-17 ENCOUNTER — Other Ambulatory Visit: Payer: Self-pay

## 2019-04-17 VITALS — BP 126/68 | HR 69 | Temp 98.7°F | Wt 110.4 lb

## 2019-04-17 DIAGNOSIS — R109 Unspecified abdominal pain: Secondary | ICD-10-CM

## 2019-04-17 DIAGNOSIS — R102 Pelvic and perineal pain: Secondary | ICD-10-CM | POA: Diagnosis not present

## 2019-04-17 DIAGNOSIS — R350 Frequency of micturition: Secondary | ICD-10-CM | POA: Diagnosis not present

## 2019-04-17 DIAGNOSIS — M545 Low back pain, unspecified: Secondary | ICD-10-CM

## 2019-04-17 LAB — POCT URINALYSIS DIP (MANUAL ENTRY)
Bilirubin, UA: NEGATIVE
Blood, UA: NEGATIVE
Glucose, UA: NEGATIVE mg/dL
Ketones, POC UA: NEGATIVE mg/dL
Nitrite, UA: NEGATIVE
Protein Ur, POC: 30 mg/dL — AB
Spec Grav, UA: >=1.03 — AB (ref 1.010–1.025)
Urobilinogen, UA: 0.2 E.U./dL
pH, UA: 5 (ref 5.0–8.0)

## 2019-04-17 MED ORDER — CEPHALEXIN 500 MG PO CAPS: 500.0000 mg | ORAL_CAPSULE | Freq: Two times a day (BID) | ORAL | 0 refills | Status: DC

## 2019-04-17 MED ORDER — MELOXICAM 7.5 MG PO TABS: 7.5000 mg | ORAL_TABLET | Freq: Every day | ORAL | 0 refills | Status: DC | PRN

## 2019-04-17 NOTE — Progress Notes (Signed)
Subjective:    Patient ID: [name], female    DOB: [birth year], 63 y.o.   MRN: [id number]  HPI Tamara Cole is a 63 y.o. female Presents today for: Chief Complaint  Patient presents with  . Flank Pain    flank pain for the past 3 days. Think from drinking too many soda   R low back pain: Started 4 days ago.  Drank more pepsi that day. In past had some sodas cause back pain. Treated with unknown med. Had not had sx's for years.  Drinks about (4) 20 oz. Pepsi per day. Tries to decrease to 1 or 2 on good days - weekends.  No known history of nephrolithiasis.   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 Turning at times. Same job 17 yrs.   No hematuria, no dysuria. One day had some increased urinary frequency last weekend after drinking tea, normal since.   No bowel or bladder incontinence, no saddle anesthesia, no lower extremity weakness.  No leg radiation.   No attempted treatments.   Patient Active Problem List   Diagnosis Date Noted  . Family history of diabetes mellitus 01/24/2015   Past Medical History:  Diagnosis Date  . Allergy    Past Surgical History:  Procedure Laterality Date  . ABDOMINAL HYSTERECTOMY     No Known Allergies Prior to Admission medications   Not on File   Social History   Socioeconomic History  . Marital status: Single    Spouse name: Not on file  . Number of children: Not on file  . Years of education: Not on file  . Highest education level: Not on file  Occupational History  . Not on file  Social Needs  . Financial resource strain: Not on file  . Food insecurity    Worry: Not on file    Inability: Not on file  . Transportation needs    Medical: Not on file    Non-medical: Not on file  Tobacco Use  . Smoking status: Never Smoker  . Smokeless tobacco: Never Used  Substance and Sexual Activity  . Alcohol use: No  . Drug use: No  . Sexual activity: Not on file   Lifestyle  . Physical activity    Days per week: Not on file    Minutes per session: Not on file  . Stress: Not on file  Relationships  . Social Herbalist on phone: Not on file    Gets together: Not on file    Attends religious service: Not on file    Active member of club or organization: Not on file    Attends meetings of clubs or organizations: Not on file    Relationship status: Not on file  . Intimate partner violence    Fear of current or ex partner: Not on file    Emotionally abused: Not on file    Physically abused: Not on file    Forced sexual activity: Not on file  Other Topics Concern  . Not on file  Social History Narrative  . Not on file    Review of Systems Per HPI.     Objective:   Physical Exam Constitutional:      General: She is not in acute distress.    Appearance: She is well-developed.  HENT:     Head: Normocephalic and atraumatic.  Cardiovascular:     Rate and Rhythm: Normal rate.  Pulmonary:  Effort: Pulmonary effort is normal.  Abdominal:     General: There is no distension.     Tenderness: There is abdominal tenderness (min suprapubic). There is no right CVA tenderness, left CVA tenderness, guarding or rebound.     Hernia: No hernia is present.  Musculoskeletal:     Lumbar back: She exhibits tenderness. She exhibits normal range of motion.       Back:  Neurological:     Mental Status: She is alert and oriented to person, place, and time.     Deep Tendon Reflexes:     Reflex Scores:      Patellar reflexes are 2+ on the right side and 2+ on the left side.      Achilles reflexes are 2+ on the right side and 2+ on the left side.   Vitals:   04/17/19 1131  BP: 126/68  Pulse: 69  Temp: 98.7 F (37.1 C)  TempSrc: Oral  SpO2: 98%  Weight: 110 lb 6.4 oz (50.1 kg)    Results for orders placed or performed in visit on 04/17/19  POCT urinalysis dipstick  Result Value Ref Range   Color, UA yellow yellow   Clarity, UA  clear clear   Glucose, UA negative negative mg/dL   Bilirubin, UA negative negative   Ketones, POC UA negative negative mg/dL   Spec Grav, UA >=4.098 (A) 1.010 - 1.025   Blood, UA negative negative   pH, UA 5.0 5.0 - 8.0   Protein Ur, POC =30 (A) negative mg/dL   Urobilinogen, UA 0.2 0.2 or 1.0 E.U./dL   Nitrite, UA Negative Negative   Leukocytes, UA Small (1+) (A) Negative      Assessment & Plan:    Tamara Cole is a 63 y.o. female Flank pain - Plan: POCT urinalysis dipstick, meloxicam (MOBIC) 7.5 MG tablet Suprapubic abdominal pain - Plan: cephALEXin (KEFLEX) 500 MG capsule, Urine Culture Urinary frequency - Plan: cephALEXin (KEFLEX) 500 MG capsule, Urine Culture Acute right-sided low back pain without sciatica - Plan: cephALEXin (KEFLEX) 500 MG capsule, meloxicam (MOBIC) 7.5 MG tablet  Based on exam, suspect flank pain is more likely musculoskeletal but no red flags on exam or history.  Imaging deferred.  Did have some suprapubic tenderness, prior urinary frequency and leukocytes noted on urinalysis.  -Start Keflex, check urine culture  -Symptomatic care with heat or ice, gentle range of motion, meloxicam as needed once per day for the next few weeks only.  RTC precautions if persistent or worsening  -Increase water intake, decrease soda intake.  Meds ordered this encounter  Medications  . cephALEXin (KEFLEX) 500 MG capsule    Sig: Take 1 capsule (500 mg total) by mouth 2 (two) times daily.    Dispense:  20 capsule    Refill:  0  . meloxicam (MOBIC) 7.5 MG tablet    Sig: Take 1 tablet (7.5 mg total) by mouth daily as needed for pain.    Dispense:  15 tablet    Refill:  0   Patient Instructions    Okay to start antibiotic for possible urinary tract infection but I will also check a urine culture.  The right low back pain is more likely due to muscle strain.  See information on low back pain below.  Heat or ice to affected area, gentle range of motion and stretches  throughout the day as tolerated.  Meloxicam up to once per day if needed for pain.  If that is not improving in the  next 2 weeks, or any worsening symptoms sooner, please return for recheck.  Try to cut back on the Pepsi intake to no more than 1 to 2/day for now.  Water is best including flavored water if needed.  Return to the clinic or go to the nearest emergency room if any of your symptoms worsen or new symptoms occur.   Acute Back Pain, Adult Acute back pain is sudden and usually short-lived. It is often caused by an injury to the muscles and tissues in the back. The injury may result from:  A muscle or ligament getting overstretched or torn (strained). Ligaments are tissues that connect bones to each other. Lifting something improperly can cause a back strain.  Wear and tear (degeneration) of the spinal disks. Spinal disks are circular tissue that provides cushioning between the bones of the spine (vertebrae).  Twisting motions, such as while playing sports or doing yard work.  A hit to the back.  Arthritis. You may have a physical exam, lab tests, and imaging tests to find the cause of your pain. Acute back pain usually goes away with rest and home care. Follow these instructions at home: Managing pain, stiffness, and swelling  Take over-the-counter and prescription medicines only as told by your health care provider.  Your health care provider may recommend applying ice during the first 24-48 hours after your pain starts. To do this: ? Put ice in a plastic bag. ? Place a towel between your skin and the bag. ? Leave the ice on for 20 minutes, 2-3 times a day.  If directed, apply heat to the affected area as often as told by your health care provider. Use the heat source that your health care provider recommends, such as a moist heat pack or a heating pad. ? Place a towel between your skin and the heat source. ? Leave the heat on for 20-30 minutes. ? Remove the heat if your  skin turns bright red. This is especially important if you are unable to feel pain, heat, or cold. You have a greater risk of getting burned. Activity   Do not stay in bed. Staying in bed for more than 1-2 days can delay your recovery.  Sit up and stand up straight. Avoid leaning forward when you sit, or hunching over when you stand. ? If you work at a desk, sit close to it so you do not need to lean over. Keep your chin tucked in. Keep your neck drawn back, and keep your elbows bent at a right angle. Your arms should look like the letter "L." ? Sit high and close to the steering wheel when you drive. Add lower back (lumbar) support to your car seat, if needed.  Take short walks on even surfaces as soon as you are able. Try to increase the length of time you walk each day.  Do not sit, drive, or stand in one place for more than 30 minutes at a time. Sitting or standing for long periods of time can put stress on your back.  Do not drive or use heavy machinery while taking prescription pain medicine.  Use proper lifting techniques. When you bend and lift, use positions that put less stress on your back: ? [place] your knees. ? Keep the load close to your body. ? Avoid twisting.  Exercise regularly as told by your health care provider. Exercising helps your back heal faster and helps prevent back injuries by keeping muscles strong and flexible.  Work with  a physical therapist to make a safe exercise program, as recommended by your health care provider. Do any exercises as told by your physical therapist. Lifestyle  Maintain a healthy weight. Extra weight puts stress on your back and makes it difficult to have good posture.  Avoid activities or situations that make you feel anxious or stressed. Stress and anxiety increase muscle tension and can make back pain worse. Learn ways to manage anxiety and stress, such as through exercise. General instructions  Sleep on a firm mattress in a  comfortable position. Try lying on your side with your knees slightly bent. If you lie on your back, put a pillow under your knees.  Follow your treatment plan as told by your health care provider. This may include: ? Cognitive or behavioral therapy. ? Acupuncture or massage therapy. ? Meditation or yoga. Contact a health care provider if:  You have pain that is not relieved with rest or medicine.  You have increasing pain going down into your legs or buttocks.  Your pain does not improve after 2 weeks.  You have pain at night.  You lose weight without trying.  You have a fever or chills. Get help right away if:  You develop new bowel or bladder control problems.  You have unusual weakness or numbness in your arms or legs.  You develop nausea or vomiting.  You develop abdominal pain.  You feel faint. Summary  Acute back pain is sudden and usually short-lived.  Use proper lifting techniques. When you bend and lift, use positions that put less stress on your back.  Take over-the-counter and prescription medicines and apply heat or ice as directed by your health care provider. This information is not intended to replace advice given to you by your health care provider. Make sure you discuss any questions you have with your health care provider. Document Released: 06/04/2005 Document Revised: 09/23/2018 Document Reviewed: 01/16/2017 Elsevier Patient Education  [employer]2020 [employer]    If you have lab work done today you will be contacted with your lab results within the next 2 weeks.  If you have not heard from us then please contact us. The fastest way to get your results is to register for My Chart.   IF you received an x-ray today, you will receive an invoice from [facility]. Please contact [facility] at [phone] with questions or concerns regarding your invoice.   IF you received labwork today, you will receive an invoice from [facility]. Please  contact [facility] at [phone] with questions or concerns regarding your invoice.   Our billing staff will not be able to assist you with questions regarding bills from these companies.  You will be contacted with the lab results as soon as they are available. The fastest way to get your results is to activate your My Chart account. Instructions are located on the last page of this paperwork. If you have not heard from us regarding the results in 2 weeks, please contact this office.       Signed,   Meredith StaggersJeffrey Caden Fatica, MD [facility].  04/17/19 12:28 PM

## 2019-04-17 NOTE — Patient Instructions (Addendum)
Okay to start antibiotic for possible urinary tract infection but I will also check a urine culture.  The right low back pain is more likely due to muscle strain.  See information on low back pain below.  Heat or ice to affected area, gentle range of motion and stretches throughout the day as tolerated.  Meloxicam up to once per day if needed for pain.  If that is not improving in the next 2 weeks, or any worsening symptoms sooner, please return for recheck.  Try to cut back on the Pepsi intake to no more than 1 to 2/day for now.  Water is best including flavored water if needed.  Return to the clinic or go to the nearest emergency room if any of your symptoms worsen or new symptoms occur.   Acute Back Pain, Adult Acute back pain is sudden and usually short-lived. It is often caused by an injury to the muscles and tissues in the back. The injury may result from:  A muscle or ligament getting overstretched or torn (strained). Ligaments are tissues that connect bones to each other. Lifting something improperly can cause a back strain.  Wear and tear (degeneration) of the spinal disks. Spinal disks are circular tissue that provides cushioning between the bones of the spine (vertebrae).  Twisting motions, such as while playing sports or doing yard work.  A hit to the back.  Arthritis. You may have a physical exam, lab tests, and imaging tests to find the cause of your pain. Acute back pain usually goes away with rest and home care. Follow these instructions at home: Managing pain, stiffness, and swelling  Take over-the-counter and prescription medicines only as told by your health care provider.  Your health care provider may recommend applying ice during the first 24-48 hours after your pain starts. To do this: ? Put ice in a plastic bag. ? Place a towel between your skin and the bag. ? Leave the ice on for 20 minutes, 2-3 times a day.  If directed, apply heat to the affected area as  often as told by your health care provider. Use the heat source that your health care provider recommends, such as a moist heat pack or a heating pad. ? Place a towel between your skin and the heat source. ? Leave the heat on for 20-30 minutes. ? Remove the heat if your skin turns bright red. This is especially important if you are unable to feel pain, heat, or cold. You have a greater risk of getting burned. Activity   Do not stay in bed. Staying in bed for more than 1-2 days can delay your recovery.  Sit up and stand up straight. Avoid leaning forward when you sit, or hunching over when you stand. ? If you work at a desk, sit close to it so you do not need to lean over. Keep your chin tucked in. Keep your neck drawn back, and keep your elbows bent at a right angle. Your arms should look like the letter "L." ? Sit high and close to the steering wheel when you drive. Add lower back (lumbar) support to your car seat, if needed.  Take short walks on even surfaces as soon as you are able. Try to increase the length of time you walk each day.  Do not sit, drive, or stand in one place for more than 30 minutes at a time. Sitting or standing for long periods of time can put stress on your back.  Do  not drive or use heavy machinery while taking prescription pain medicine.  Use proper lifting techniques. When you bend and lift, use positions that put less stress on your back: ? [place] your knees. ? Keep the load close to your body. ? Avoid twisting.  Exercise regularly as told by your health care provider. Exercising helps your back heal faster and helps prevent back injuries by keeping muscles strong and flexible.  Work with a physical therapist to make a safe exercise program, as recommended by your health care provider. Do any exercises as told by your physical therapist. Lifestyle  Maintain a healthy weight. Extra weight puts stress on your back and makes it difficult to have good  posture.  Avoid activities or situations that make you feel anxious or stressed. Stress and anxiety increase muscle tension and can make back pain worse. Learn ways to manage anxiety and stress, such as through exercise. General instructions  Sleep on a firm mattress in a comfortable position. Try lying on your side with your knees slightly bent. If you lie on your back, put a pillow under your knees.  Follow your treatment plan as told by your health care provider. This may include: ? Cognitive or behavioral therapy. ? Acupuncture or massage therapy. ? Meditation or yoga. Contact a health care provider if:  You have pain that is not relieved with rest or medicine.  You have increasing pain going down into your legs or buttocks.  Your pain does not improve after 2 weeks.  You have pain at night.  You lose weight without trying.  You have a fever or chills. Get help right away if:  You develop new bowel or bladder control problems.  You have unusual weakness or numbness in your arms or legs.  You develop nausea or vomiting.  You develop abdominal pain.  You feel faint. Summary  Acute back pain is sudden and usually short-lived.  Use proper lifting techniques. When you bend and lift, use positions that put less stress on your back.  Take over-the-counter and prescription medicines and apply heat or ice as directed by your health care provider. This information is not intended to replace advice given to you by your health care provider. Make sure you discuss any questions you have with your health care provider. Document Released: 06/04/2005 Document Revised: 09/23/2018 Document Reviewed: 01/16/2017 Elsevier Patient Education  [employer].    If you have lab work done today you will be contacted with your lab results within the next 2 weeks.  If you have not heard from [place] then please contact us. The fastest way to get your results is to register for My  Chart.   IF you received an x-ray today, you will receive an invoice from [facility]. Please contact [facility] at [phone] with questions or concerns regarding your invoice.   IF you received labwork today, you will receive an invoice from [facility]. Please contact [facility] at [phone] with questions or concerns regarding your invoice.   Our billing staff will not be able to assist you with questions regarding bills from these companies.  You will be contacted with the lab results as soon as they are available. The fastest way to get your results is to activate your My Chart account. Instructions are located on the last page of this paperwork. If you have not heard from [place] regarding the results in 2 weeks, please contact this office.

## 2019-04-18 LAB — URINE CULTURE: Organism ID, Bacteria: NO GROWTH

## 2019-12-28 ENCOUNTER — Ambulatory Visit (INDEPENDENT_AMBULATORY_CARE_PROVIDER_SITE_OTHER): Payer: PRIVATE HEALTH INSURANCE | Admitting: Registered Nurse

## 2019-12-28 ENCOUNTER — Encounter: Payer: Self-pay | Admitting: Registered Nurse

## 2019-12-28 ENCOUNTER — Other Ambulatory Visit: Payer: Self-pay

## 2019-12-28 VITALS — BP 134/66 | HR 75 | Temp 97.7°F | Resp 18 | Ht 63.0 in | Wt 113.8 lb

## 2019-12-28 DIAGNOSIS — R109 Unspecified abdominal pain: Secondary | ICD-10-CM | POA: Diagnosis not present

## 2019-12-28 LAB — POCT URINALYSIS DIP (MANUAL ENTRY)
Bilirubin, UA: NEGATIVE
Blood, UA: NEGATIVE
Glucose, UA: NEGATIVE mg/dL
Ketones, POC UA: NEGATIVE mg/dL
Nitrite, UA: NEGATIVE
Protein Ur, POC: NEGATIVE mg/dL
Spec Grav, UA: >=1.03 — AB (ref 1.010–1.025)
Urobilinogen, UA: 0.2 E.U./dL
pH, UA: 5.5 (ref 5.0–8.0)

## 2019-12-28 LAB — POCT CBC
Granulocyte percent: 55 % (ref 37–80)
HCT, POC: 36.4 % (ref 29–41)
Hemoglobin: 12 g/dL (ref 11–14.6)
Lymph, poc: 1.6 (ref 0.6–3.4)
MCH, POC: 27.9 pg (ref 27–31.2)
MCHC: 32.9 g/dL (ref 31.8–35.4)
MCV: 84.8 fL (ref 76–111)
MID (cbc): 0.4 (ref 0–0.9)
MPV: 5.9 fL (ref 0–99.8)
POC Granulocyte: 2.5 (ref 2–6.9)
POC LYMPH PERCENT: 36.3 % (ref 10–50)
POC MID %: 8.7 % (ref 0–12)
Platelet Count, POC: 320 10*3/uL (ref 142–424)
RBC: 4.29 M/uL (ref 4.04–5.48)
RDW, POC: 12.1 %
WBC: 4.5 10*3/uL — AB (ref 4.6–10.2)

## 2019-12-28 MED ORDER — PHENAZOPYRIDINE HCL 95 MG PO TABS: 95.0000 mg | ORAL_TABLET | Freq: Three times a day (TID) | ORAL | 0 refills | Status: DC | PRN

## 2019-12-28 MED ORDER — NITROFURANTOIN MONOHYD MACRO 100 MG PO CAPS: 100.0000 mg | ORAL_CAPSULE | Freq: Two times a day (BID) | ORAL | 0 refills | Status: AC

## 2019-12-28 NOTE — Progress Notes (Signed)
Acute Office Visit  Subjective:    Patient ID: Tamara Cole, female    DOB: 02-13-1956, 64 y.o.   MRN: [id number]  Chief Complaint  Patient presents with   Back Pain    Patient states she is returning necause she just had some side pain was a kidney infection and she thinks thats the same reason why she is here today. Per patient she was prescribed Cephalexin but doesn't think it helped    HPI Patient is in today for flank pain  Some urinary discomfort. No blood in urine. No fever, chills, fatigue, nausea, vomiting, or diarrhea.  Was seen for this issue in October. Given keflex and told to improve hydration. This worked, but now pain and discomfort is back No vaginal symptoms   Past Medical History:  Diagnosis Date   Allergy     Past Surgical History:  Procedure Laterality Date   ABDOMINAL HYSTERECTOMY      Family History  Problem Relation Age of Onset   Diabetes Mother     Social History   Socioeconomic History   Marital status: Single    Spouse name: Not on file   Number of children: Not on file   Years of education: Not on file   Highest education level: Not on file  Occupational History   Not on file  Tobacco Use   Smoking status: Never Smoker   Smokeless tobacco: Never Used  Vaping Use   Vaping Use: Never used  Substance and Sexual Activity   Alcohol use: No   Drug use: No   Sexual activity: Not on file  Other Topics Concern   Not on file  Social History Narrative   Not on file   Social Determinants of Health   Financial Resource Strain:    Difficulty of Paying Living Expenses:   Food Insecurity:    Worried About Programme researcher, broadcasting/film/video in the Last Year:    Barista in the Last Year:   Transportation Needs:    Freight forwarder (Medical):    Lack of Transportation (Non-Medical):   Physical Activity:    Days of Exercise per Week:    Minutes of Exercise per Session:   Stress:    Feeling of Stress :     Social Connections:    Frequency of Communication with Friends and Family:    Frequency of Social Gatherings with Friends and Family:    Attends Religious Services:    Active Member of Clubs or Organizations:    Attends Engineer, structural:    Marital Status:   Intimate Partner Violence:    Fear of Current or Ex-Partner:    Emotionally Abused:    Physically Abused:    Sexually Abused:     Outpatient Medications Prior to Visit  Medication Sig Dispense Refill   cephALEXin (KEFLEX) 500 MG capsule Take 1 capsule (500 mg total) by mouth 2 (two) times daily. (Patient not taking: Reported on 12/28/2019) 20 capsule 0   meloxicam (MOBIC) 7.5 MG tablet Take 1 tablet (7.5 mg total) by mouth daily as needed for pain. (Patient not taking: Reported on 12/28/2019) 15 tablet 0   No facility-administered medications prior to visit.    No Known Allergies  Review of Systems  Constitutional: Negative.   HENT: Negative.   Eyes: Negative.   Respiratory: Negative.   Cardiovascular: Negative.   Gastrointestinal: Negative.   Endocrine: Negative.   Genitourinary: Positive for dysuria and flank pain. Negative for decreased  urine volume, difficulty urinating, dyspareunia, enuresis, frequency, genital sores, hematuria, menstrual problem, pelvic pain, urgency, vaginal bleeding, vaginal discharge and vaginal pain.  Musculoskeletal: Negative for arthralgias, back pain, gait problem, joint swelling, myalgias, neck pain and neck stiffness.  Skin: Negative.   Allergic/Immunologic: Negative.   Neurological: Negative.   Hematological: Negative.   Psychiatric/Behavioral: Negative.   All other systems reviewed and are negative.      Objective:    Physical Exam Vitals and nursing note reviewed.  Constitutional:      General: She is not in acute distress.    Appearance: Normal appearance. She is normal weight. She is not ill-appearing, toxic-appearing or diaphoretic.  Cardiovascular:      Rate and Rhythm: Normal rate and regular rhythm.  Abdominal:     Tenderness: There is no right CVA tenderness or left CVA tenderness.  Skin:    General: Skin is warm and dry.  Neurological:     General: No focal deficit present.     Mental Status: She is alert and oriented to person, place, and time. Mental status is at baseline.  Psychiatric:        Mood and Affect: Mood normal.        Behavior: Behavior normal.        Thought Content: Thought content normal.        Judgment: Judgment normal.     BP 134/66    Pulse 75    Temp 97.7 F (36.5 C) (Temporal)    Resp 18    Ht 5\' 3"  (1.6 m)    Wt 113 lb 12.8 oz (51.6 kg)    SpO2 98%    BMI 20.16 kg/m  Wt Readings from Last 3 Encounters:  12/28/19 113 lb 12.8 oz (51.6 kg)  04/17/19 110 lb 6.4 oz (50.1 kg)  03/11/19 110 lb 9.6 oz (50.2 kg)    There are no preventive care reminders to display for this patient.  There are no preventive care reminders to display for this patient.   No results found for: TSH Lab Results  Component Value Date   WBC 4.5 (A) 12/28/2019   HGB 12.0 12/28/2019   HCT 36.4 12/28/2019   MCV 84.8 12/28/2019   PLT  12/24/2018    PATIENT IDENTIFICATION ERROR. PLEASE DISREGARD RESULTS. ACCOUNT WILL BE CREDITED.   Lab Results  Component Value Date   NA 138 03/11/2019   K 4.3 03/11/2019   CO2 24 03/11/2019   GLUCOSE 86 03/11/2019   BUN 13 03/11/2019   CREATININE 0.64 03/11/2019   BILITOT 0.4 03/11/2019   ALKPHOS 97 03/11/2019   AST 24 03/11/2019   ALT 15 03/11/2019   PROT 7.4 03/11/2019   ALBUMIN 4.3 03/11/2019   CALCIUM 9.3 03/11/2019   Lab Results  Component Value Date   CHOL 261 (H) 03/11/2019   Lab Results  Component Value Date   HDL 93 03/11/2019   Lab Results  Component Value Date   LDLCALC 149 (H) 03/11/2019   Lab Results  Component Value Date   TRIG 113 03/11/2019   Lab Results  Component Value Date   CHOLHDL 2.8 03/11/2019   Lab Results  Component Value Date   HGBA1C  5.3 01/19/2016       Assessment & Plan:   Problem List Items Addressed This Visit    None    Visit Diagnoses    Flank pain    -  Primary   Relevant Medications   nitrofurantoin, macrocrystal-monohydrate, (MACROBID) 100 MG  capsule   phenazopyridine (PYRIDIUM) 95 MG tablet   Other Relevant Orders   POCT urinalysis dipstick (Completed)   POCT CBC (Completed)   Urine Culture       Meds ordered this encounter  Medications   nitrofurantoin, macrocrystal-monohydrate, (MACROBID) 100 MG capsule    Sig: Take 1 capsule (100 mg total) by mouth 2 (two) times daily for 5 days.    Dispense:  10 capsule    Refill:  0    Order Specific Question:   Supervising Provider    Answer:   Neva Seat, JEFFREY R [[id number]]   phenazopyridine (PYRIDIUM) 95 MG tablet    Sig: Take 1 tablet (95 mg total) by mouth 3 (three) times daily as needed for pain.    Dispense:  10 tablet    Refill:  0    Order Specific Question:   Supervising Provider    Answer:   Neva Seat, JEFFREY R [[id number]]   PLAN  POCT UA showing leuks and high gravity. Potential start of UTI as well as dehydration. Encouraged hydration - pt should drink 3L of water daily  macrobid 100mg  PO bid for 5 days  phenazopyridium 95mg  PO tid PRN for dysuria and flank pain  CBC reviewed, showing no indication of systemic infection. WBC mildly low, likely of no clinical concern.  Return precautions reviewed  , NP

## 2019-12-28 NOTE — Patient Instructions (Signed)
° ° ° °  If you have lab work done today you will be contacted with your lab results within the next 2 weeks.  If you have not heard from us then please contact us. The fastest way to get your results is to register for My Chart. ° ° °IF you received an x-ray today, you will receive an invoice from [facility]. Please contact [facility] at [phone] with questions or concerns regarding your invoice.  ° °IF you received labwork today, you will receive an invoice from [facility]. Please contact [facility] at [phone] with questions or concerns regarding your invoice.  ° °Our billing staff will not be able to assist you with questions regarding bills from these companies. ° °You will be contacted with the lab results as soon as they are available. The fastest way to get your results is to activate your My Chart account. Instructions are located on the last page of this paperwork. If you have not heard from us regarding the results in 2 weeks, please contact this office. °  ° ° ° °

## 2019-12-30 ENCOUNTER — Telehealth: Payer: Self-pay | Admitting: Family Medicine

## 2019-12-30 ENCOUNTER — Ambulatory Visit: Payer: PRIVATE HEALTH INSURANCE | Admitting: Registered Nurse

## 2019-12-30 LAB — URINE CULTURE

## 2019-12-30 NOTE — Progress Notes (Signed)
If we could call Ms Lister -   No signs of any bacteria in her urine that would lead to her UTI. If her symptoms resolve, that's great, if not, she should let us know so we can take further steps.  Thank you  Jari Sportsman, NP

## 2019-12-30 NOTE — Telephone Encounter (Signed)
Spoke to pt and she stated her Urinary sx has resolved. Urine culture was normal as I have informed her and she was happy about results.

## 2019-12-30 NOTE — Telephone Encounter (Signed)
Pt would like a cb concerning her most recent labs. She gets off work after 3:00. Please advise [phone].

## 2020-03-11 ENCOUNTER — Ambulatory Visit (INDEPENDENT_AMBULATORY_CARE_PROVIDER_SITE_OTHER): Payer: PRIVATE HEALTH INSURANCE | Admitting: Family Medicine

## 2020-03-11 ENCOUNTER — Encounter: Payer: Self-pay | Admitting: Family Medicine

## 2020-03-11 ENCOUNTER — Other Ambulatory Visit: Payer: Self-pay

## 2020-03-11 VITALS — BP 138/78 | HR 77 | Temp 98.0°F | Ht 63.0 in | Wt 114.0 lb

## 2020-03-11 DIAGNOSIS — E785 Hyperlipidemia, unspecified: Secondary | ICD-10-CM | POA: Diagnosis not present

## 2020-03-11 DIAGNOSIS — Z0001 Encounter for general adult medical examination with abnormal findings: Secondary | ICD-10-CM

## 2020-03-11 DIAGNOSIS — Z87898 Personal history of other specified conditions: Secondary | ICD-10-CM

## 2020-03-11 DIAGNOSIS — Z0000 Encounter for general adult medical examination without abnormal findings: Secondary | ICD-10-CM

## 2020-03-11 DIAGNOSIS — Z1231 Encounter for screening mammogram for malignant neoplasm of breast: Secondary | ICD-10-CM

## 2020-03-11 DIAGNOSIS — Z131 Encounter for screening for diabetes mellitus: Secondary | ICD-10-CM | POA: Diagnosis not present

## 2020-03-11 NOTE — Patient Instructions (Addendum)
I do recommend the covid vaccine, but with your previous symptoms of flu vaccine I would recommend having that done at a healthcare facility.   Follow up with dentist. Let me know if you need some recommendations.   I will refer you for mammogram, but make sure to discuss any potential costs for that study with the imaging facility ahead of time.   Take care.   Health Maintenance After Age 65 After age 22, you are at a higher risk for certain long-term diseases and infections as well as injuries from falls. Falls are a major cause of broken bones and head injuries in people who are older than age 33. Getting regular preventive care can help to keep you healthy and well. Preventive care includes getting regular testing and making lifestyle changes as recommended by your health care provider. Talk with your health care provider about:  Which screenings and tests you should have. A screening is a test that checks for a disease when you have no symptoms.  A diet and exercise plan that is right for you. What should I know about screenings and tests to prevent falls? Screening and testing are the best ways to find a health problem early. Early diagnosis and treatment give you the best chance of managing medical conditions that are common after age 84. Certain conditions and lifestyle choices may make you more likely to have a fall. Your health care provider may recommend:  Regular vision checks. Poor vision and conditions such as cataracts can make you more likely to have a fall. If you wear glasses, make sure to get your prescription updated if your vision changes.  Medicine review. Work with your health care provider to regularly review all of the medicines you are taking, including over-the-counter medicines. Ask your health care provider about any side effects that may make you more likely to have a fall. Tell your health care provider if any medicines that you take make you feel dizzy or  sleepy.  Osteoporosis screening. Osteoporosis is a condition that causes the bones to get weaker. This can make the bones weak and cause them to break more easily.  Blood pressure screening. Blood pressure changes and medicines to control blood pressure can make you feel dizzy.  Strength and balance checks. Your health care provider may recommend certain tests to check your strength and balance while standing, walking, or changing positions.  Foot health exam. Foot pain and numbness, as well as not wearing proper footwear, can make you more likely to have a fall.  Depression screening. You may be more likely to have a fall if you have a fear of falling, feel emotionally low, or feel unable to do activities that you used to do.  Alcohol use screening. Using too much alcohol can affect your balance and may make you more likely to have a fall. What actions can I take to lower my risk of falls? General instructions  Talk with your health care provider about your risks for falling. Tell your health care provider if: ? You fall. Be sure to tell your health care provider about all falls, even ones that seem minor. ? You feel dizzy, sleepy, or off-balance.  Take over-the-counter and prescription medicines only as told by your health care provider. These include any supplements.  Eat a healthy diet and maintain a healthy weight. A healthy diet includes low-fat dairy products, low-fat (lean) meats, and fiber from whole grains, beans, and lots of fruits and vegetables. Home safety  Remove any tripping hazards, such as rugs, cords, and clutter.  Install safety equipment such as grab bars in bathrooms and safety rails on stairs.  Keep rooms and walkways well-lit. Activity   Follow a regular exercise program to stay fit. This will help you maintain your balance. Ask your health care provider what types of exercise are appropriate for you.  If you need a cane or walker, use it as recommended by  your health care provider.  Wear supportive shoes that have nonskid soles. Lifestyle  Do not drink alcohol if your health care provider tells you not to drink.  If you drink alcohol, limit how much you have: ? 0-1 drink a day for women. ? 0-2 drinks a day for men.  Be aware of how much alcohol is in your drink. In the [place] one drink equals one typical bottle of beer (12 oz), one-half glass of wine (5 oz), or one shot of hard liquor (1 oz).  Do not use any products that contain nicotine or tobacco, such as cigarettes and e-cigarettes. If you need help quitting, ask your health care provider. Summary  Having a healthy lifestyle and getting preventive care can help to protect your health and wellness after age 74.  Screening and testing are the best way to find a health problem early and help you avoid having a fall. Early diagnosis and treatment give you the best chance for managing medical conditions that are more common for people who are older than age 38.  Falls are a major cause of broken bones and head injuries in people who are older than age 63. Take precautions to prevent a fall at home.  Work with your health care provider to learn what changes you can make to improve your health and wellness and to prevent falls. This information is not intended to replace advice given to you by your health care provider. Make sure you discuss any questions you have with your health care provider. Document Revised: 09/25/2018 Document Reviewed: 04/17/2017 Elsevier Patient Education  [employer].   If you have lab work done today you will be contacted with your lab results within the next 2 weeks.  If you have not heard from [place] then please contact us. The fastest way to get your results is to register for My Chart.   IF you received an x-ray today, you will receive an invoice from [facility]. Please contact [facility] at [phone] with questions or concerns  regarding your invoice.   IF you received labwork today, you will receive an invoice from [facility]. Please contact [facility] at [phone] with questions or concerns regarding your invoice.   Our billing staff will not be able to assist you with questions regarding bills from these companies.  You will be contacted with the lab results as soon as they are available. The fastest way to get your results is to activate your My Chart account. Instructions are located on the last page of this paperwork. If you have not heard from [place] regarding the results in 2 weeks, please contact this office.

## 2020-03-11 NOTE — Progress Notes (Signed)
Subjective:  Patient ID: Tamara Cole, female    DOB: 01/09/1956  Age: 64 y.o. MRN: [id number]  CC:  Chief Complaint  Patient presents with  . Annual Exam    Pt reports she feels fine with no complaints.pt conserned about covid vaccine.    HPI Tamara Cole presents for  Annual wellness exam/physical.   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  Hyperlipidemia: History of elevated LDL, but great HDL.  Low ASCVD risk score.  No current meds. Fasting currently.   The 10-year ASCVD risk score Denman George(Goff [place] Montez HagemanJr., et al., 2013) is: 6.7%   Values used to calculate the score:     Age: 89 years     Sex: Female     Is Non-Hispanic African American: No     Diabetic: No     Tobacco smoker: No     Systolic Blood Pressure: 138 mmHg     Is BP treated: No     HDL Cholesterol: 93 mg/dL     Total Cholesterol: 261 mg/dL  Lab Results  Component Value Date   CHOL 261 (H) 03/11/2019   HDL 93 03/11/2019   LDLCALC 149 (H) 03/11/2019   TRIG 113 03/11/2019   CHOLHDL 2.8 03/11/2019   Lab Results  Component Value Date   ALT 15 03/11/2019   AST 24 03/11/2019   ALKPHOS 97 03/11/2019   BILITOT 0.4 03/11/2019   Cancer screening Cologuard 03/20/2018 Mammogram 03/12/2019.  Stable appearance of probable asymmetric fibroglandular tissue in the superior portion of the right breast, no evidence of malignancy.  Recommended bilateral diagnostic mammogram and possible right breast ultrasound in 1 year. No new breast lumps. Checks regularly.   Has declined bone density testing. - still declines  Immunization History  Administered Date(s) Administered  . Tdap 03/08/2011  Declines flu vaccine declines, passed out form flu vaccine few times prior.  Covid vaccine: declines - considering.  Shingles vaccine - declines.   Fall Risk  03/11/2020 12/28/2019 04/17/2019 03/11/2019 02/10/2018  Falls in the past year? 0 0 0 0 No  Number falls in past yr: -  0 0 0 -  Injury with Fall? - 0 0 0 -  Follow up Falls evaluation completed Falls evaluation completed Falls evaluation completed Falls evaluation completed -   [facility] 03/11/2020 12/28/2019 04/17/2019 03/11/2019 02/10/2018  Decreased Interest 0 0 0 0 0  Down, Depressed, Hopeless 0 0 0 0 0  PHQ - 2 Score 0 0 0 0 0  no loose rugs in home.  Adequate lighting.  1 level home, has grab bars in bathroom.     Hearing Screening   125Hz  250Hz  500Hz  1000Hz  2000Hz  3000Hz  4000Hz  6000Hz  8000Hz   Right ear:           Left ear:             Visual Acuity Screening   Right eye Left eye Both eyes  Without correction:     With correction: 20/30 20/40 20/25   had appt with eye specialist this year. Wears glasses.   Dental: None recent. Plans on establishing care.   Exercise: Daily with physical work at [employer].   Advanced directives: Has living will and HCPOA.    History Patient Active Problem List   Diagnosis Date Noted  . Family history of diabetes mellitus 01/24/2015  . Routine health maintenance 07/01/2013   Past Medical History:  Diagnosis Date  . Allergy  Past Surgical History:  Procedure Laterality Date  . ABDOMINAL HYSTERECTOMY     No Known Allergies Prior to Admission medications   Not on File   Social History   Socioeconomic History  . Marital status: Single    Spouse name: Not on file  . Number of children: Not on file  . Years of education: Not on file  . Highest education level: Not on file  Occupational History  . Not on file  Tobacco Use  . Smoking status: Never Smoker  . Smokeless tobacco: Never Used  Vaping Use  . Vaping Use: Never used  Substance and Sexual Activity  . Alcohol use: No  . Drug use: No  . Sexual activity: Not Currently  Other Topics Concern  . Not on file  Social History Narrative  . Not on file   Social Determinants of Health   Financial Resource Strain:   . Difficulty of Paying  Living Expenses: Not on file  Food Insecurity:   . Worried About Programme researcher, broadcasting/film/video in the Last Year: Not on file  . Ran Out of Food in the Last Year: Not on file  Transportation Needs:   . Lack of Transportation (Medical): Not on file  . Lack of Transportation (Non-Medical): Not on file  Physical Activity:   . Days of Exercise per Week: Not on file  . Minutes of Exercise per Session: Not on file  Stress:   . Feeling of Stress : Not on file  Social Connections:   . Frequency of Communication with Friends and Family: Not on file  . Frequency of Social Gatherings with Friends and Family: Not on file  . Attends Religious Services: Not on file  . Active Member of Clubs or Organizations: Not on file  . Attends Banker Meetings: Not on file  . Marital Status: Not on file  Intimate Partner Violence:   . Fear of Current or Ex-Partner: Not on file  . Emotionally Abused: Not on file  . Physically Abused: Not on file  . Sexually Abused: Not on file    Review of Systems  13 point review of systems per patient health survey noted.  Negative other than as indicated above or in HPI.   Objective:   Vitals:   03/11/20 1321 03/11/20 1327  BP: (!) 147/75 138/78  Pulse: 77   Temp: 98 F (36.7 C)   TempSrc: Temporal   SpO2: 98%   Weight: 114 lb (51.7 kg)   Height: 5\' 3"  (1.6 m)      Physical Exam Constitutional:      Appearance: She is well-developed.  HENT:     Head: Normocephalic and atraumatic.     Right Ear: External ear normal.     Left Ear: External ear normal.  Eyes:     Conjunctiva/sclera: Conjunctivae normal.     Pupils: Pupils are equal, round, and reactive to light.  Neck:     Thyroid: No thyromegaly.  Cardiovascular:     Rate and Rhythm: Normal rate and regular rhythm.     Heart sounds: Normal heart sounds. No murmur heard.   Pulmonary:     Effort: Pulmonary effort is normal. No respiratory distress.     Breath sounds: Normal breath sounds. No  wheezing.  Abdominal:     General: Bowel sounds are normal.     Palpations: Abdomen is soft.     Tenderness: There is no abdominal tenderness.  Musculoskeletal:  General: No tenderness. Normal range of motion.     Cervical back: Normal range of motion and neck supple.  Lymphadenopathy:     Cervical: No cervical adenopathy.  Skin:    General: Skin is warm and dry.     Findings: No rash.  Neurological:     Mental Status: She is alert and oriented to person, place, and time.  Psychiatric:        Behavior: Behavior normal.        Thought Content: Thought content normal.        Assessment & Plan:  Tamara Cole is a 64 y.o. female . Annual physical exam - Plan: Full code  - -anticipatory guidance as below in AVS, screening labs above. Health maintenance items as above in HPI discussed/recommended as applicable.   Hyperlipidemia, unspecified hyperlipidemia type - Plan: Lipid panel, Comprehensive metabolic panel  -Mild elevation previously, but low ASCVD risk score with great HDL.  Repeat testing  Screening for diabetes mellitus - Plan: Hemoglobin A1c  Encounter for screening mammogram for malignant neoplasm of breast - Plan: MM Digital Diagnostic Bilat History of abnormal mammogram - Plan: MM Digital Diagnostic Bilat  -Referred for digital diagnostic mammogram as previously recommended with potential ultrasound needed.  With her insurance coverage, I did recommend that she discuss potential cost with the imaging facility ahead of time.  No orders of the defined types were placed in this encounter.  Patient Instructions    I do recommend the covid vaccine, but with your previous symptoms of flu vaccine I would recommend having that done at a healthcare facility.   Follow up with dentist. Let me know if you need some recommendations.   I will refer you for mammogram, but make sure to discuss any potential costs for that study with the imaging facility ahead of time.     Take care.   Health Maintenance After Age 35 After age 64, you are at a higher risk for certain long-term diseases and infections as well as injuries from falls. Falls are a major cause of broken bones and head injuries in people who are older than age 29. Getting regular preventive care can help to keep you healthy and well. Preventive care includes getting regular testing and making lifestyle changes as recommended by your health care provider. Talk with your health care provider about:  Which screenings and tests you should have. A screening is a test that checks for a disease when you have no symptoms.  A diet and exercise plan that is right for you. What should I know about screenings and tests to prevent falls? Screening and testing are the best ways to find a health problem early. Early diagnosis and treatment give you the best chance of managing medical conditions that are common after age 26. Certain conditions and lifestyle choices may make you more likely to have a fall. Your health care provider may recommend:  Regular vision checks. Poor vision and conditions such as cataracts can make you more likely to have a fall. If you wear glasses, make sure to get your prescription updated if your vision changes.  Medicine review. Work with your health care provider to regularly review all of the medicines you are taking, including over-the-counter medicines. Ask your health care provider about any side effects that may make you more likely to have a fall. Tell your health care provider if any medicines that you take make you feel dizzy or sleepy.  Osteoporosis screening. Osteoporosis is a  condition that causes the bones to get weaker. This can make the bones weak and cause them to break more easily.  Blood pressure screening. Blood pressure changes and medicines to control blood pressure can make you feel dizzy.  Strength and balance checks. Your health care provider may recommend certain  tests to check your strength and balance while standing, walking, or changing positions.  Foot health exam. Foot pain and numbness, as well as not wearing proper footwear, can make you more likely to have a fall.  Depression screening. You may be more likely to have a fall if you have a fear of falling, feel emotionally low, or feel unable to do activities that you used to do.  Alcohol use screening. Using too much alcohol can affect your balance and may make you more likely to have a fall. What actions can I take to lower my risk of falls? General instructions  Talk with your health care provider about your risks for falling. Tell your health care provider if: ? You fall. Be sure to tell your health care provider about all falls, even ones that seem minor. ? You feel dizzy, sleepy, or off-balance.  Take over-the-counter and prescription medicines only as told by your health care provider. These include any supplements.  Eat a healthy diet and maintain a healthy weight. A healthy diet includes low-fat dairy products, low-fat (lean) meats, and fiber from whole grains, beans, and lots of fruits and vegetables. Home safety  Remove any tripping hazards, such as rugs, cords, and clutter.  Install safety equipment such as grab bars in bathrooms and safety rails on stairs.  Keep rooms and walkways well-lit. Activity   Follow a regular exercise program to stay fit. This will help you maintain your balance. Ask your health care provider what types of exercise are appropriate for you.  If you need a cane or walker, use it as recommended by your health care provider.  Wear supportive shoes that have nonskid soles. Lifestyle  Do not drink alcohol if your health care provider tells you not to drink.  If you drink alcohol, limit how much you have: ? 0-1 drink a day for women. ? 0-2 drinks a day for men.  Be aware of how much alcohol is in your drink. In the [place] one drink equals one  typical bottle of beer (12 oz), one-half glass of wine (5 oz), or one shot of hard liquor (1 oz).  Do not use any products that contain nicotine or tobacco, such as cigarettes and e-cigarettes. If you need help quitting, ask your health care provider. Summary  Having a healthy lifestyle and getting preventive care can help to protect your health and wellness after age 81.  Screening and testing are the best way to find a health problem early and help you avoid having a fall. Early diagnosis and treatment give you the best chance for managing medical conditions that are more common for people who are older than age 12.  Falls are a major cause of broken bones and head injuries in people who are older than age 56. Take precautions to prevent a fall at home.  Work with your health care provider to learn what changes you can make to improve your health and wellness and to prevent falls. This information is not intended to replace advice given to you by your health care provider. Make sure you discuss any questions you have with your health care provider. Document Revised: 09/25/2018 Document Reviewed: 04/17/2017  Elsevier Patient Education  [employer].   If you have lab work done today you will be contacted with your lab results within the next 2 weeks.  If you have not heard from [place] then please contact us. The fastest way to get your results is to register for My Chart.   IF you received an x-ray today, you will receive an invoice from [facility]. Please contact [facility] at [phone] with questions or concerns regarding your invoice.   IF you received labwork today, you will receive an invoice from [facility]. Please contact [facility] at [phone] with questions or concerns regarding your invoice.   Our billing staff will not be able to assist you with questions regarding bills from these companies.  You will be contacted with the lab results as soon as they  are available. The fastest way to get your results is to activate your My Chart account. Instructions are located on the last page of this paperwork. If you have not heard from [place] regarding the results in 2 weeks, please contact this office.         Signed, Meredith Staggers, MD [facility]

## 2020-03-12 LAB — COMPREHENSIVE METABOLIC PANEL
ALT: 35 IU/L — ABNORMAL HIGH (ref 0–32)
AST: 29 IU/L (ref 0–40)
Albumin/Globulin Ratio: 1.6 (ref 1.2–2.2)
Albumin: 4.5 g/dL (ref 3.8–4.8)
Alkaline Phosphatase: 108 IU/L (ref 44–121)
BUN/Creatinine Ratio: 19 (ref 12–28)
BUN: 11 mg/dL (ref 8–27)
Bilirubin Total: 0.8 mg/dL (ref 0.0–1.2)
CO2: 25 mmol/L (ref 20–29)
Calcium: 9.6 mg/dL (ref 8.7–10.3)
Chloride: 101 mmol/L (ref 96–106)
Creatinine, Ser: 0.57 mg/dL (ref 0.57–1.00)
GFR calc Af Amer: 112 mL/min/{1.73_m2} (ref >59)
GFR calc non Af Amer: 97 mL/min/{1.73_m2} (ref >59)
Globulin, Total: 2.9 g/dL (ref 1.5–4.5)
Glucose: 90 mg/dL (ref 65–99)
Potassium: 4.1 mmol/L (ref 3.5–5.2)
Sodium: 140 mmol/L (ref 134–144)
Total Protein: 7.4 g/dL (ref 6.0–8.5)

## 2020-03-12 LAB — LIPID PANEL
Chol/HDL Ratio: 2.5 ratio (ref 0.0–4.4)
Cholesterol, Total: 254 mg/dL — ABNORMAL HIGH (ref 100–199)
HDL: 101 mg/dL (ref >39)
LDL Chol Calc (NIH): 145 mg/dL — ABNORMAL HIGH (ref 0–99)
Triglycerides: 51 mg/dL (ref 0–149)
VLDL Cholesterol Cal: 8 mg/dL (ref 5–40)

## 2020-03-12 LAB — HEMOGLOBIN A1C
Est. average glucose Bld gHb Est-mCnc: 111 mg/dL
Hgb A1c MFr Bld: 5.5 % (ref 4.8–5.6)

## 2020-03-16 ENCOUNTER — Other Ambulatory Visit: Payer: Self-pay | Admitting: Family Medicine

## 2020-03-16 DIAGNOSIS — Z1231 Encounter for screening mammogram for malignant neoplasm of breast: Secondary | ICD-10-CM

## 2020-03-16 DIAGNOSIS — Z87898 Personal history of other specified conditions: Secondary | ICD-10-CM

## 2020-06-15 ENCOUNTER — Telehealth (INDEPENDENT_AMBULATORY_CARE_PROVIDER_SITE_OTHER): Payer: PRIVATE HEALTH INSURANCE | Admitting: Emergency Medicine

## 2020-06-15 ENCOUNTER — Other Ambulatory Visit: Payer: Self-pay

## 2020-06-15 ENCOUNTER — Encounter: Payer: Self-pay | Admitting: Emergency Medicine

## 2020-06-15 VITALS — Ht 63.0 in | Wt 110.0 lb

## 2020-06-15 DIAGNOSIS — R6889 Other general symptoms and signs: Secondary | ICD-10-CM | POA: Diagnosis not present

## 2020-06-15 DIAGNOSIS — U071 COVID-19: Secondary | ICD-10-CM

## 2020-06-15 NOTE — Progress Notes (Signed)
Telemedicine Encounter- SOAP NOTE Established Patient Patient: Home  Provider: Office     This telephone encounter was conducted with the patient's (or proxy's) verbal consent via audio telecommunications: yes/no: Yes Patient was instructed to have this encounter in a suitably private space; and to only have persons present to whom they give permission to participate. In addition, patient identity was confirmed by use of name plus two identifiers (DOB and address).  I discussed the limitations, risks, security and privacy concerns of performing an evaluation and management service by telephone and the availability of in person appointments. I also discussed with the patient that there may be a patient responsible charge related to this service. The patient expressed understanding and agreed to proceed.  I spent a total of TIME; 0 MIN TO 60 MIN: 20 minutes talking with the patient or their proxy.  Chief Complaint  Patient presents with  . Nasal Congestion  . Headache    Per patient tested for Covid 19 today positive.   . Fever    Per patient between  99.2 97.5 degrees    Subjective   Tamara Cole is a 63 y.o. female established patient. Telephone visit today complaining of mild flulike symptoms that started last Sunday.  Tested positive for Covid today.  Denies difficulty breathing or chest pain.  Able to eat and drink.  Denies nausea or vomiting.  Denies abdominal pain or diarrhea.  Denies chest pain or palpitations.  Denies difficulty breathing or dyspnea on exertion. No other complaints or medical concerns today.  HPI   There are no problems to display for this patient.   Past Medical History:  Diagnosis Date  . Allergy     No current outpatient medications on file.   No current facility-administered medications for this visit.    No Known Allergies  Social History   Socioeconomic History  . Marital status: Single    Spouse name: Not on file  . Number of  children: Not on file  . Years of education: Not on file  . Highest education level: Not on file  Occupational History  . Not on file  Tobacco Use  . Smoking status: Never Smoker  . Smokeless tobacco: Never Used  Vaping Use  . Vaping Use: Never used  Substance and Sexual Activity  . Alcohol use: No  . Drug use: No  . Sexual activity: Not Currently  Other Topics Concern  . Not on file  Social History Narrative  . Not on file   Social Determinants of Health   Financial Resource Strain: Not on file  Food Insecurity: Not on file  Transportation Needs: Not on file  Physical Activity: Not on file  Stress: Not on file  Social Connections: Not on file  Intimate Partner Violence: Not on file    Review of Systems  Constitutional: Positive for fever. Negative for malaise/fatigue.  HENT: Positive for congestion.   Respiratory: Negative for cough and shortness of breath.   Cardiovascular: Negative for chest pain and palpitations.  Gastrointestinal: Negative.  Negative for abdominal pain, diarrhea, nausea and vomiting.  Genitourinary: Negative.   Musculoskeletal: Positive for myalgias.  Skin: Negative.  Negative for rash.  Neurological: Positive for headaches. Negative for dizziness.  All other systems reviewed and are negative.   Objective  Alert and oriented x3 in no apparent respiratory distress. Vitals as reported by the patient: Today's Vitals   06/15/20 1718  Weight: 110 lb (49.9 kg)  Height: 5\' 3"  (1.6 m)  There are no diagnoses linked to this encounter. Female was seen today for nasal congestion, headache and fever.  Diagnoses and all orders for this visit:  COVID-19 virus infection  Flu-like symptoms   Clinically stable.  No red flag signs or symptoms.  Covid instructions given. ED precautions given. Advised to contact the office if no better or worse in the next several days.  I discussed the assessment and treatment plan with the patient. The patient  was provided an opportunity to ask questions and all were answered. The patient agreed with the plan and demonstrated an understanding of the instructions.   The patient was advised to call back or seek an in-person evaluation if the symptoms worsen or if the condition fails to improve as anticipated.  I provided 20 minutes of non-face-to-face time during this encounter.  Georgina Quint, MD  Primary Care at [facility]

## 2020-06-15 NOTE — Patient Instructions (Signed)
° ° ° °  If you have lab work done today you will be contacted with your lab results within the next 2 weeks.  If you have not heard from us then please contact us. The fastest way to get your results is to register for My Chart. ° ° °IF you received an x-ray today, you will receive an invoice from [facility]. Please contact [facility] at [phone] with questions or concerns regarding your invoice.  ° °IF you received labwork today, you will receive an invoice from [facility]. Please contact [facility] at [phone] with questions or concerns regarding your invoice.  ° °Our billing staff will not be able to assist you with questions regarding bills from these companies. ° °You will be contacted with the lab results as soon as they are available. The fastest way to get your results is to activate your My Chart account. Instructions are located on the last page of this paperwork. If you have not heard from us regarding the results in 2 weeks, please contact this office. °  ° ° ° °

## 2020-06-22 DIAGNOSIS — R5383 Other fatigue: Secondary | ICD-10-CM | POA: Diagnosis present

## 2020-06-22 DIAGNOSIS — U071 COVID-19: Secondary | ICD-10-CM | POA: Insufficient documentation

## 2020-06-23 ENCOUNTER — Encounter (HOSPITAL_COMMUNITY): Payer: Self-pay

## 2020-06-23 ENCOUNTER — Emergency Department (HOSPITAL_COMMUNITY)
Admission: EM | Admit: 2020-06-23 | Discharge: 2020-06-23 | Disposition: A | Discharge status: Home / Self Care | Payer: 59 | Attending: Emergency Medicine | Admitting: Emergency Medicine

## 2020-06-23 ENCOUNTER — Other Ambulatory Visit: Payer: Self-pay

## 2020-06-23 DIAGNOSIS — R112 Nausea with vomiting, unspecified: Secondary | ICD-10-CM

## 2020-06-23 DIAGNOSIS — U071 COVID-19: Secondary | ICD-10-CM

## 2020-06-23 LAB — RESP PANEL BY RT-PCR (FLU A&B, COVID) ARPGX2
Influenza A by PCR: NEGATIVE
Influenza B by PCR: NEGATIVE
SARS Coronavirus 2 by RT PCR: POSITIVE — AB

## 2020-06-23 MED ORDER — ONDANSETRON 4 MG PO TBDP: 4.0000 mg | ORAL_TABLET | Freq: Three times a day (TID) | ORAL | 0 refills | Status: DC | PRN

## 2020-06-23 MED ORDER — ONDANSETRON 4 MG PO TBDP: 4.0000 mg | ORAL_TABLET | Freq: Three times a day (TID) | ORAL | 0 refills | Status: AC | PRN

## 2020-06-23 MED ORDER — ONDANSETRON 4 MG PO TBDP
4.0000 mg | ORAL_TABLET | Freq: Once | ORAL | Status: AC
  Administered 2020-06-23: 4 mg via ORAL
  Filled 2020-06-23: qty 1

## 2020-06-23 NOTE — ED Provider Notes (Signed)
[facility] DEPT Provider Note  CSN: [id number] Arrival date & time: 06/22/20 2325  Chief Complaint(s) Covid Positive  HPI Tamara Cole is a 65 y.o. female recently diagnosed with COVID-19 on Christmas Day, presents to the emergency department for continued generalized malaise and fatigue.  She denies any fevers, coughing, shortness of breath.  Denies any abdominal pain.  Does endorse postprandial nausea with intermittent nonbloody nonbilious emesis.  She endorses mild headache.  No other physical complaints.  States that she does not feel good enough to go back to work yet.  HPI  Past Medical History Past Medical History:  Diagnosis Date  . Allergy    There are no problems to display for this patient.  Home Medication(s) Prior to Admission medications   Medication Sig Start Date End Date Taking? Authorizing Provider  ondansetron (ZOFRAN ODT) 4 MG disintegrating tablet Take 1 tablet (4 mg total) by mouth every 8 (eight) hours as needed for up to 3 days for nausea or vomiting. 06/23/20 06/26/20 Yes Nailani Full, Grayce Sessions, MD                                                                                                                                    Past Surgical History Past Surgical History:  Procedure Laterality Date  . ABDOMINAL HYSTERECTOMY     Family History Family History  Problem Relation Age of Onset  . Diabetes Mother     Social History Social History   Tobacco Use  . Smoking status: Never Smoker  . Smokeless tobacco: Never Used  Vaping Use  . Vaping Use: Never used  Substance Use Topics  . Alcohol use: No  . Drug use: No   Allergies Patient has no known allergies.  Review of Systems Review of Systems All other systems are reviewed and are negative for acute change except as noted in the HPI  Physical Exam Vital Signs  I have reviewed the triage vital signs BP 125/75 (BP Location: Right Arm)   Pulse 78   Temp 99  F (37.2 C) (Oral)   Resp 18  SpO2 97%  Physical Exam Vitals reviewed.  Constitutional:      General: She is not in acute distress.    Appearance: She is well-developed and well-nourished. She is not diaphoretic.  HENT:     Head: Normocephalic and atraumatic.     Right Ear: External ear normal.     Left Ear: External ear normal.     Nose: Nose normal.  Eyes:     General: No scleral icterus.    Extraocular Movements: EOM normal.     Conjunctiva/sclera: Conjunctivae normal.  Neck:     Trachea: Phonation normal.  Cardiovascular:     Rate and Rhythm: Normal rate and regular rhythm.  Pulmonary:     Effort: Pulmonary effort is normal. No respiratory distress.     Breath sounds: No stridor.  Abdominal:  General: There is no distension.     Tenderness: There is no abdominal tenderness.  Musculoskeletal:        General: No edema. Normal range of motion.     Cervical back: Normal range of motion.  Neurological:     Mental Status: She is alert and oriented to person, place, and time.  Psychiatric:        Mood and Affect: Mood and affect normal.        Behavior: Behavior normal.     ED Results and Treatments Labs (all labs ordered are listed, but only abnormal results are displayed) Labs Reviewed  RESP PANEL BY RT-PCR (FLU A&B, COVID) ARPGX2 - Abnormal; Notable for the following components:      Result Value   SARS Coronavirus 2 by RT PCR POSITIVE (*)    All other components within normal limits                                                                                                                         EKG  EKG Interpretation  Date/Time:    Ventricular Rate:    PR Interval:    QRS Duration:   QT Interval:    QTC Calculation:   R Axis:     Text Interpretation:        Radiology No results found.  Pertinent labs & imaging results that were available during my care of the patient were reviewed by me and considered in my medical decision making (see chart  for details).  Medications Ordered in ED Medications  ondansetron (ZOFRAN-ODT) disintegrating tablet 4 mg (has no administration in time range)                                                                                                                                    Procedures Procedures  (including critical care time)  Medical Decision Making / ED Course I have reviewed the nursing notes for this encounter and the patient's prior records (if available in EHR or on provided paperwork).   [name] was evaluated in Emergency Department on 06/23/2020 for the symptoms described in the history of present illness. She was evaluated in the context of the global COVID-19 pandemic, which necessitated consideration that the patient might be at risk for infection with the SARS-CoV-2 virus that causes COVID-19. Institutional protocols and algorithms that pertain to the evaluation of patients at  risk for COVID-19 are in a state of rapid change based on information released by regulatory bodies including the CDC and federal and state organizations. These policies and algorithms were followed during the patient's care in the ED.  Known COVID-positive.  Here for continued malaise and fatigue. Patient is well-appearing and well-hydrated, nontoxic. Satting well on room air. Abdomen benign. Provided with ODT Zofran. Able to tolerate oral intake.  Recommended continued supportive management.  PCP follow-up for work note.      Final Clinical Impression(s) / ED Diagnoses Final diagnoses:  COVID-19 virus infection  Nausea and vomiting in adult   The patient appears reasonably screened and/or stabilized for discharge and I doubt any other medical condition or other [facility] requiring further screening, evaluation, or treatment in the ED at this time prior to discharge. Safe for discharge with strict return precautions.  Disposition: Discharge  Condition: Good  I have discussed the  results, Dx and Tx plan with the patient/family who expressed understanding and agree(s) with the plan. Discharge instructions discussed at length. The patient/family was given strict return precautions who verbalized understanding of the instructions. No further questions at time of discharge.    ED Discharge Orders         Ordered    ondansetron (ZOFRAN ODT) 4 MG disintegrating tablet  Every 8 hours PRN        06/23/20 0456           Follow Up: Shade Flood, MD [address] [place] [address] [phone]  Call  To schedule an appointment for close follow up      This chart was dictated using voice recognition software.  Despite best efforts to proofread,  errors can occur which can change the documentation meaning.   Nira Conn, MD 06/23/20 [phone]

## 2020-06-23 NOTE — ED Triage Notes (Signed)
Pt arrives stating she tested positive 12 days ago. Denies fevers, cough or shob. Sts while brushing teeth they began bleeding and has a headache. Pt says "I don't know I just do not feel good".

## 2020-06-23 NOTE — ED Notes (Signed)
Patient provided water.

## 2020-06-24 ENCOUNTER — Telehealth (INDEPENDENT_AMBULATORY_CARE_PROVIDER_SITE_OTHER): Payer: PRIVATE HEALTH INSURANCE | Admitting: Family Medicine

## 2020-06-24 ENCOUNTER — Encounter: Payer: Self-pay | Admitting: Family Medicine

## 2020-06-24 VITALS — Ht 63.0 in | Wt 110.0 lb

## 2020-06-24 DIAGNOSIS — R1110 Vomiting, unspecified: Secondary | ICD-10-CM | POA: Diagnosis not present

## 2020-06-24 DIAGNOSIS — U071 COVID-19: Secondary | ICD-10-CM | POA: Diagnosis not present

## 2020-06-24 DIAGNOSIS — R059 Cough, unspecified: Secondary | ICD-10-CM | POA: Diagnosis not present

## 2020-06-24 MED ORDER — BENZONATATE 100 MG PO CAPS: 100.0000 mg | ORAL_CAPSULE | Freq: Three times a day (TID) | ORAL | 0 refills | Status: AC | PRN

## 2020-06-24 NOTE — Progress Notes (Signed)
Virtual Visit via Telephone Note  I connected with Tamara Cole on 06/24/20 at 5:46 PM by telephone and verified that I am speaking with the correct person using two identifiers. Patient location:home.  My location: office   I discussed the limitations, risks, security and privacy concerns of performing an evaluation and management service by telephone and the availability of in person appointments. I also discussed with the patient that there may be a patient responsible charge related to this service. The patient expressed understanding and agreed to proceed, consent obtained  Chief complaint:  Chief Complaint  Patient presents with  . Covid Positive    Pt tested positive for covid yesterday. And needs a work note. Pt states the hospital didn't do anything for her treatment wise.     History of Present Illness:  Covid infection:  Virtual visit on 06/15/20 with Dr. Alvy Bimler. Started with mild flulike symptoms 06/12/20. Positive covid test on 12/29 at [facility]   Symptoms.   Symptoms worsened, had more fever, vomiting, worse headaches. Seen in the ER yesterday. Repeat covid test positive. zofran given in ER, she feels like made vomiting worse. She has not filled zofran Rx from ER. Continuing to have posttussive emesis only - 3 episodes since yesterday.  No meds for cough. No diarrhea.  Fever 100 today.  Feels about same as yesterday. Not feeling short of breath, just weak.  Feels about same weakness as yesterday. Trying to drink fluids - fluids are staying down.  UOP 8-9 times today as drinking more fluids.   Has not received covid vaccine.     There are no problems to display for this patient.  Past Medical History:  Diagnosis Date  . Allergy    Past Surgical History:  Procedure Laterality Date  . ABDOMINAL HYSTERECTOMY     No Known Allergies Prior to Admission medications   Medication Sig Start Date End Date Taking? Authorizing Provider  ondansetron  (ZOFRAN ODT) 4 MG disintegrating tablet Take 1 tablet (4 mg total) by mouth every 8 (eight) hours as needed for up to 3 days for nausea or vomiting. 06/23/20 06/26/20 Yes Cardama, Amadeo Garnet, MD   Social History   Socioeconomic History  . Marital status: Single    Spouse name: Not on file  . Number of children: Not on file  . Years of education: Not on file  . Highest education level: Not on file  Occupational History  . Not on file  Tobacco Use  . Smoking status: Never Smoker  . Smokeless tobacco: Never Used  Vaping Use  . Vaping Use: Never used  Substance and Sexual Activity  . Alcohol use: No  . Drug use: No  . Sexual activity: Not Currently  Other Topics Concern  . Not on file  Social History Narrative  . Not on file   Social Determinants of Health   Financial Resource Strain: Not on file  Food Insecurity: Not on file  Transportation Needs: Not on file  Physical Activity: Not on file  Stress: Not on file  Social Connections: Not on file  Intimate Partner Violence: Not on file     Observations/Objective: Vitals:   06/24/20 1608  Weight: 110 lb (49.9 kg)  Height: 5\' 3"  (1.6 m)  Speaking in full sentences on phone without respiratory distress. Coherent with questions and responses. All questions were answered.    Assessment and Plan: COVID-19 virus infection - Plan: benzonatate (TESSALON) 100 MG capsule  Cough  Post-tussive emesis  -COVID-19  infection with initial symptoms December 26. Now approximately 12 days into symptoms. Suspected posttussive emesis as positive vomiting. Has not tried home Zofran, but suspect Mucinex DM or Tessalon may help control cough. By history she is staying hydrated, denies dyspnea. Appears to still be stable for home treatment at this time with ER precautions given if any worsening symptoms over the weekend. Repeat virtual visit in 3 days. Out of work letter provided for at least next 5 days given current symptoms, but that can be  adjusted based on her follow-up visit.  Follow Up Instructions: 3 days virtual visit, ER precautions   I discussed the assessment and treatment plan with the patient. The patient was provided an opportunity to ask questions and all were answered. The patient agreed with the plan and demonstrated an understanding of the instructions.   The patient was advised to call back or seek an in-person evaluation if the symptoms worsen or if the condition fails to improve as anticipated.  I provided 20 minutes of non-face-to-face time during this encounter.  Signed,   Meredith Staggers, MD Primary Care at [facility].  06/24/20

## 2020-06-27 ENCOUNTER — Telehealth (INDEPENDENT_AMBULATORY_CARE_PROVIDER_SITE_OTHER): Payer: PRIVATE HEALTH INSURANCE | Admitting: Family Medicine

## 2020-06-27 ENCOUNTER — Encounter: Payer: Self-pay | Admitting: Family Medicine

## 2020-06-27 ENCOUNTER — Other Ambulatory Visit: Payer: Self-pay

## 2020-06-27 VITALS — Ht 63.0 in | Wt 102.0 lb

## 2020-06-27 DIAGNOSIS — R059 Cough, unspecified: Secondary | ICD-10-CM | POA: Diagnosis not present

## 2020-06-27 DIAGNOSIS — U071 COVID-19: Secondary | ICD-10-CM

## 2020-06-27 DIAGNOSIS — R1110 Vomiting, unspecified: Secondary | ICD-10-CM | POA: Diagnosis not present

## 2020-06-27 MED ORDER — AZITHROMYCIN 250 MG PO TABS: ORAL_TABLET | ORAL | 0 refills | Status: AC

## 2020-06-27 NOTE — Patient Instructions (Addendum)
Continue tessalon and mucinex for cough. Start azithromycin for possible pneumonia. Out of work for now.      If you have lab work done today you will be contacted with your lab results within the next 2 weeks.  If you have not heard from [place] then please contact us. The fastest way to get your results is to register for My Chart.   IF you received an x-ray today, you will receive an invoice from [facility]. Please contact [facility] at [phone] with questions or concerns regarding your invoice.   IF you received labwork today, you will receive an invoice from [facility]. Please contact [facility] at [phone] with questions or concerns regarding your invoice.   Our billing staff will not be able to assist you with questions regarding bills from these companies.  You will be contacted with the lab results as soon as they are available. The fastest way to get your results is to activate your My Chart account. Instructions are located on the last page of this paperwork. If you have not heard from [place] regarding the results in 2 weeks, please contact this office.

## 2020-06-27 NOTE — Progress Notes (Signed)
Virtual Visit via Telephone Note  I connected with Tamara Cole on 06/27/20 at 5:55 PM by telephone and verified that I am speaking with the correct person using two identifiers. Patient location:home My location: office.    I discussed the limitations, risks, security and privacy concerns of performing an evaluation and management service by telephone and the availability of in person appointments. I also discussed with the patient that there may be a patient responsible charge related to this service. The patient expressed understanding and agreed to proceed, consent obtained  Chief complaint:  Chief Complaint  Patient presents with  . Follow-up    On pneumonia. Pt reports she has picked up some Musenx as instructed, but pt states since then she has had night sweats. Pt reports a low grade fever since last spoke. Pt has picked up medication that was prescribed for the pts cough and vomiting. Pt states the medication worked vary well.    History of Present Illness: Tamara Cole is a 65 y.o. female  COVID-19 infection: Follow-up from virtual visit 3 days ago.  Initial virtual visit 1229 with Dr. Alvy Bimler.  Mild flulike symptoms December 26.  Positive COVID test December 29.  [facility].  Worsening symptoms, seen in the ER January 6.  Repeat COVID test positive.  Zofran given in the ER, felt like made her vomiting worse but on further discussion it appeared to be posttussive emesis.  Denied dyspnea just generalized weakness on her phone call on Friday.  Fluids were staying down, and weakness was stable from previous day.  Urine output 8-9 times that day. Tessalon prescribed and recommended mucinex.   Has been using mucinex DM. Has had night sweats with mucinex DM past 2 nights, temp 99.9.  Some chills yesterday, not today. Taking tessalon 3 times per day.  Drinking fluids still. Feeling better than Friday.  Cough lessened. No further vomiting or post-tussive emesis.  Tired today - about the same. Temp seems to be going down.  Not dyspneic.   Physical work. Has not been back to job.    There are no problems to display for this patient.  Past Medical History:  Diagnosis Date  . Allergy    Past Surgical History:  Procedure Laterality Date  . ABDOMINAL HYSTERECTOMY     No Known Allergies Prior to Admission medications   Medication Sig Start Date End Date Taking? Authorizing Provider  benzonatate (TESSALON) 100 MG capsule Take 1 capsule (100 mg total) by mouth 3 (three) times daily as needed for cough. 06/24/20  Yes Shade Flood, MD   Social History   Socioeconomic History  . Marital status: Single    Spouse name: Not on file  . Number of children: Not on file  . Years of education: Not on file  . Highest education level: Not on file  Occupational History  . Not on file  Tobacco Use  . Smoking status: Never Smoker  . Smokeless tobacco: Never Used  Vaping Use  . Vaping Use: Never used  Substance and Sexual Activity  . Alcohol use: No  . Drug use: No  . Sexual activity: Not Currently  Other Topics Concern  . Not on file  Social History Narrative  . Not on file   Social Determinants of Health   Financial Resource Strain: Not on file  Food Insecurity: Not on file  Transportation Needs: Not on file  Physical Activity: Not on file  Stress: Not on file  Social Connections: Not on file  Intimate Partner Violence: Not on file     Observations/Objective: Vitals:   06/27/20 1325  Weight: 102 lb (46.3 kg)  Height: 5\' 3"  (1.6 m)  speaking in full sentences. No respiratory distress. All questions answered and understanding expressed  Assessment and Plan: Cough - Plan: azithromycin (ZITHROMAX) 250 MG tablet  COVID-19 virus infection - Plan: azithromycin (ZITHROMAX) 250 MG tablet  Post-tussive emesis Cough improved, still with fatigue.  Night sweats, low-grade fever, borderline temps.  Possible post COVID-pneumonia as initial  COVID symptoms December 26.  Now day 15.  Start azithromycin with urgent care/ER precautions for any respiratory distress, confusion, difficulty with hydration or acute worsening.  Virtual visit follow-up in 4 days, note provided for work for the remainder of this week given her physical job and persistent symptoms.  Follow Up Instructions: 4-day virtual.  ER precautions.   I discussed the assessment and treatment plan with the patient. The patient was provided an opportunity to ask questions and all were answered. The patient agreed with the plan and demonstrated an understanding of the instructions.   The patient was advised to call back or seek an in-person evaluation if the symptoms worsen or if the condition fails to improve as anticipated.  I provided 16 minutes of non-face-to-face time during this encounter.  Signed,   December 28, MD Primary Care at [facility].  06/27/20

## 2020-07-01 ENCOUNTER — Encounter: Payer: Self-pay | Admitting: Family Medicine

## 2020-07-01 ENCOUNTER — Other Ambulatory Visit: Payer: Self-pay

## 2020-07-01 ENCOUNTER — Telehealth (INDEPENDENT_AMBULATORY_CARE_PROVIDER_SITE_OTHER): Payer: PRIVATE HEALTH INSURANCE | Admitting: Family Medicine

## 2020-07-01 VITALS — Ht 63.0 in | Wt 105.0 lb

## 2020-07-01 DIAGNOSIS — U071 COVID-19: Secondary | ICD-10-CM | POA: Diagnosis not present

## 2020-07-01 DIAGNOSIS — R1110 Vomiting, unspecified: Secondary | ICD-10-CM | POA: Diagnosis not present

## 2020-07-01 DIAGNOSIS — R059 Cough, unspecified: Secondary | ICD-10-CM

## 2020-07-01 NOTE — Progress Notes (Signed)
Virtual Visit via Telephone Note  I connected with Tamara Cole on 07/01/20 at 5:32 PM by telephone and verified that I am speaking with the correct person using two identifiers. Patient location: home My location: office   I discussed the limitations, risks, security and privacy concerns of performing an evaluation and management service by telephone and the availability of in person appointments. I also discussed with the patient that there may be a patient responsible charge related to this service. The patient expressed understanding and agreed to proceed, consent obtained  Chief complaint: Chief Complaint  Patient presents with  . Follow-up    On cough. Pt reports she started the Z-pac. Pt reports still having night sweats and a cough at night.     History of Present Illness: Tamara Cole is a 65 y.o. female  Covid 19 infection, cough. Initial sx's 12/26, positive test 12/29.  Follow up from 1/10.  posttussive emesis improved with mucinex DM and tessalon.  Still fatigued, some chills last visit. Started on azithromycin for possible pneumonia.  Tolerating abx. No n/v.  No fevers, but still some night sweats. Temp at night 99.1 Drinking fluids ok. Cough is better.  Feels a little better since last visit. Energy level slowly improving.  Off work next week.      There are no problems to display for this patient.  Past Medical History:  Diagnosis Date  . Allergy    Past Surgical History:  Procedure Laterality Date  . ABDOMINAL HYSTERECTOMY     No Known Allergies Prior to Admission medications   Medication Sig Start Date End Date Taking? Authorizing Provider  azithromycin (ZITHROMAX) 250 MG tablet Take 2 pills by mouth on day 1, then 1 pill by mouth per day on days 2 through 5. 06/27/20  Yes Shade Flood, MD  benzonatate (TESSALON) 100 MG capsule Take 1 capsule (100 mg total) by mouth 3 (three) times daily as needed for cough. 06/24/20  Yes Shade Flood, MD   Social History   Socioeconomic History  . Marital status: Single    Spouse name: Not on file  . Number of children: Not on file  . Years of education: Not on file  . Highest education level: Not on file  Occupational History  . Not on file  Tobacco Use  . Smoking status: Never Smoker  . Smokeless tobacco: Never Used  Vaping Use  . Vaping Use: Never used  Substance and Sexual Activity  . Alcohol use: No  . Drug use: No  . Sexual activity: Not Currently  Other Topics Concern  . Not on file  Social History Narrative  . Not on file   Social Determinants of Health   Financial Resource Strain: Not on file  Food Insecurity: Not on file  Transportation Needs: Not on file  Physical Activity: Not on file  Stress: Not on file  Social Connections: Not on file  Intimate Partner Violence: Not on file     Observations/Objective: Vitals:   07/01/20 1238  Weight: 105 lb (47.6 kg)  Height: 5\' 3"  (1.6 m)   Speaking in full sentences, no respiratory distress over the phone,  Responses, coherent responses.  All questions were answered with understanding of plan expressed  Assessment and Plan: Cough  COVID-19 virus infection  Post-tussive emesis  Initial symptoms December 26, persistent cough, initial posttussive emesis but now improved.  Cough also improving.  Still some night sweats without true fever.  Suspect some improvement with recent  azithromycin, possible pneumonia.  Can return to work when she is scheduled a week from Monday as long as symptoms are improving.  She will be on vacation next week.  If not continuing to improve or  any persistent night sweats next few days I did recommend she be seen in person in 5 days for possible imaging.  ER precautions given.  Follow Up Instructions: 5 days unless significant improvement.   I discussed the assessment and treatment plan with the patient. The patient was provided an opportunity to ask questions and all  were answered. The patient agreed with the plan and demonstrated an understanding of the instructions.   The patient was advised to call back or seek an in-person evaluation if the symptoms worsen or if the condition fails to improve as anticipated.  I provided 11 minutes of non-face-to-face time during this encounter.  Signed,   Meredith Staggers, MD Primary Care at [facility].  07/01/20

## 2020-07-06 ENCOUNTER — Ambulatory Visit (INDEPENDENT_AMBULATORY_CARE_PROVIDER_SITE_OTHER): Payer: PRIVATE HEALTH INSURANCE

## 2020-07-06 ENCOUNTER — Encounter: Payer: Self-pay | Admitting: Family Medicine

## 2020-07-06 ENCOUNTER — Other Ambulatory Visit: Payer: Self-pay

## 2020-07-06 ENCOUNTER — Ambulatory Visit (INDEPENDENT_AMBULATORY_CARE_PROVIDER_SITE_OTHER): Payer: PRIVATE HEALTH INSURANCE | Admitting: Family Medicine

## 2020-07-06 VITALS — BP 126/62 | HR 72 | Temp 97.6°F | Ht 63.0 in | Wt 110.0 lb

## 2020-07-06 DIAGNOSIS — R059 Cough, unspecified: Secondary | ICD-10-CM | POA: Diagnosis not present

## 2020-07-06 DIAGNOSIS — R5383 Other fatigue: Secondary | ICD-10-CM | POA: Diagnosis not present

## 2020-07-06 DIAGNOSIS — U071 COVID-19: Secondary | ICD-10-CM | POA: Diagnosis not present

## 2020-07-06 DIAGNOSIS — R61 Generalized hyperhidrosis: Secondary | ICD-10-CM | POA: Diagnosis not present

## 2020-07-06 NOTE — Progress Notes (Signed)
Subjective:  Patient ID: Tamara Cole, female    DOB: 1955/06/25  Age: 65 y.o. MRN: [id number]  CC:  Chief Complaint  Patient presents with   Follow-up    From virtual appt on 07/02/2019 for cough and night sweats. Pt reports since the virtual appt her cough has improved, but night sweats haven't changed at all. Pt reports no new symptoms since last visit.    HPI Tamara Cole presents for   Cough, COVID-19 infection: See previous notes, virtual visits.  Initial symptoms 06/12/2020, positive COVID test 06/15/20. Symptomatic care initially, seen in ER for cough, emesis, thought to be posttussive emesis, treated with Mucinex DM, Tessalon which improved.  Persistent fatigue, chills.  Started on azithromycin for possible pneumonia/lower respiratory infection on 06/27/2020.  Cough has improved, minimal now. Still taking tessalon perles 1-2 per day.  but still with night sweats - every night.peak temp 99.1. daytime temp in 98 range.  Still drinking sufficient fluids, denies near syncopal symptoms. Eating ok - soup.  No vomiting.  No dysuria, no frequency/urgency/hematuria  No further n/v, no abd pain.  Still some fatigue. Planned rtw in 5 days. Not ready. 10 hour shifts,   History There are no problems to display for this patient.  Past Medical History:  Diagnosis Date   Allergy    Past Surgical History:  Procedure Laterality Date   ABDOMINAL HYSTERECTOMY     No Known Allergies Prior to Admission medications   Medication Sig Start Date End Date Taking? Authorizing Provider  azithromycin (ZITHROMAX) 250 MG tablet Take 2 pills by mouth on day 1, then 1 pill by mouth per day on days 2 through 5. 06/27/20  Yes Shade Flood, MD  benzonatate (TESSALON) 100 MG capsule Take 1 capsule (100 mg total) by mouth 3 (three) times daily as needed for cough. 06/24/20  Yes Shade Flood, MD   Social History   Socioeconomic History   Marital status: Single    Spouse name: Not  on file   Number of children: Not on file   Years of education: Not on file   Highest education level: Not on file  Occupational History   Not on file  Tobacco Use   Smoking status: Never Smoker   Smokeless tobacco: Never Used  Vaping Use   Vaping Use: Never used  Substance and Sexual Activity   Alcohol use: No   Drug use: No   Sexual activity: Not Currently  Other Topics Concern   Not on file  Social History Narrative   Not on file   Social Determinants of Health   Financial Resource Strain: Not on file  Food Insecurity: Not on file  Transportation Needs: Not on file  Physical Activity: Not on file  Stress: Not on file  Social Connections: Not on file  Intimate Partner Violence: Not on file    Review of Systems   Objective:   Vitals:   07/06/20 1138  BP: 126/62  Pulse: 72  Temp: 97.6 F (36.4 C)  TempSrc: Temporal  SpO2: 97%  Weight: 110 lb (49.9 kg)  Height: 5\' 3"  (1.6 m)     Physical Exam    DG Chest 2 View  Result Date: 07/06/2020 CLINICAL DATA:  Cough. EXAM: CHEST - 2 VIEW COMPARISON:  None. FINDINGS: The heart size and mediastinal contours are within normal limits. Both lungs are clear. The visualized skeletal structures are unremarkable. IMPRESSION: Negative.  No active cardiopulmonary disease. Electronically Signed   By: 07/08/2020  M.D.   On: 07/06/2020 12:01    Assessment & Plan:  Tamara Cole is a 65 y.o. female . COVID-19 virus infection  Cough - Plan: DG Chest 2 View  Night sweats - Plan: CBC, Basic metabolic panel, TSH  Fatigue, unspecified type - Plan: CBC, Basic metabolic panel  Cough improved.  Chest x-ray reassuring.  Night sweats could still be related to her COVID infection but will check CBC, TSH, BMP with RTC precautions.  Return to work discussed.  No orders of the defined types were placed in this encounter.  Patient Instructions    I expect the night sweats should improve as your other COVID-19  infection symptoms are improving.  Okay to return to work on Monday, January 31 as long as energy levels are improving, and other symptoms are improving.  If not please schedule follow-up visit with [place] next week.  Sooner if any worsening symptoms.  X-ray looked okay today but I will check some blood work as well.     If you have lab work done today you will be contacted with your lab results within the next 2 weeks.  If you have not heard from [place] then please contact us. The fastest way to get your results is to register for My Chart.   IF you received an x-ray today, you will receive an invoice from [facility]. Please contact [facility] at [phone] with questions or concerns regarding your invoice.   IF you received labwork today, you will receive an invoice from [facility]. Please contact [facility] at [phone] with questions or concerns regarding your invoice.   Our billing staff will not be able to assist you with questions regarding bills from these companies.  You will be contacted with the lab results as soon as they are available. The fastest way to get your results is to activate your My Chart account. Instructions are located on the last page of this paperwork. If you have not heard from [place] regarding the results in 2 weeks, please contact this office.         Signed, Meredith Staggers, MD [facility]

## 2020-07-06 NOTE — Patient Instructions (Addendum)
  I expect the night sweats should improve as your other COVID-19 infection symptoms are improving.  Okay to return to work on Monday, January 31 as long as energy levels are improving, and other symptoms are improving.  If not please schedule follow-up visit with [place] next week.  Sooner if any worsening symptoms.  X-ray looked okay today but I will check some blood work as well.     If you have lab work done today you will be contacted with your lab results within the next 2 weeks.  If you have not heard from [place] then please contact us. The fastest way to get your results is to register for My Chart.   IF you received an x-ray today, you will receive an invoice from [facility]. Please contact [facility] at [phone] with questions or concerns regarding your invoice.   IF you received labwork today, you will receive an invoice from [place]. Please contact [facility] at [phone] with questions or concerns regarding your invoice.   Our billing staff will not be able to assist you with questions regarding bills from these companies.  You will be contacted with the lab results as soon as they are available. The fastest way to get your results is to activate your My Chart account. Instructions are located on the last page of this paperwork. If you have not heard from [place] regarding the results in 2 weeks, please contact this office.

## 2020-07-07 ENCOUNTER — Encounter: Payer: Self-pay | Admitting: Family Medicine

## 2020-07-07 LAB — CBC
Hematocrit: 35.7 % (ref 34.0–46.6)
Hemoglobin: 11.4 g/dL (ref 11.1–15.9)
MCH: 27.2 pg (ref 26.6–33.0)
MCHC: 31.9 g/dL (ref 31.5–35.7)
MCV: 85 fL (ref 79–97)
Platelets: 352 10*3/uL (ref 150–450)
RBC: 4.19 x10E6/uL (ref 3.77–5.28)
RDW: 11.7 % (ref 11.7–15.4)
WBC: 4.3 10*3/uL (ref 3.4–10.8)

## 2020-07-07 LAB — BASIC METABOLIC PANEL
BUN/Creatinine Ratio: 15 (ref 12–28)
BUN: 9 mg/dL (ref 8–27)
CO2: 25 mmol/L (ref 20–29)
Calcium: 9.3 mg/dL (ref 8.7–10.3)
Chloride: 102 mmol/L (ref 96–106)
Creatinine, Ser: 0.6 mg/dL (ref 0.57–1.00)
GFR calc Af Amer: 110 mL/min/{1.73_m2} (ref >59)
GFR calc non Af Amer: 95 mL/min/{1.73_m2} (ref >59)
Glucose: 89 mg/dL (ref 65–99)
Potassium: 4.4 mmol/L (ref 3.5–5.2)
Sodium: 142 mmol/L (ref 134–144)

## 2020-07-07 LAB — TSH: TSH: 1.87 u[IU]/mL (ref 0.450–4.500)

## 2020-07-18 NOTE — Progress Notes (Signed)
Lab Letter sent  

## 2021-03-11 IMAGING — MG DIGITAL DIAGNOSTIC UNILATERAL RIGHT MAMMOGRAM WITH TOMO AND CAD
4 series · 4 of 12 positions shown · non-contrast
Comparison: Previous exam(s).

CLINICAL DATA: 62-year-old patient returns for right diagnostic
mammogram for evaluation of a probably benign asymmetry seen in the
posterior upper right breast, identified on the MLO view, overlying
the pectoralis muscle, on the diagnostic mammogram March 21, 2018. Ultrasound was negative.

EXAM:
DIGITAL DIAGNOSTIC UNILATERAL RIGHT MAMMOGRAM WITH CAD AND TOMO

[R MLO synth-2D]
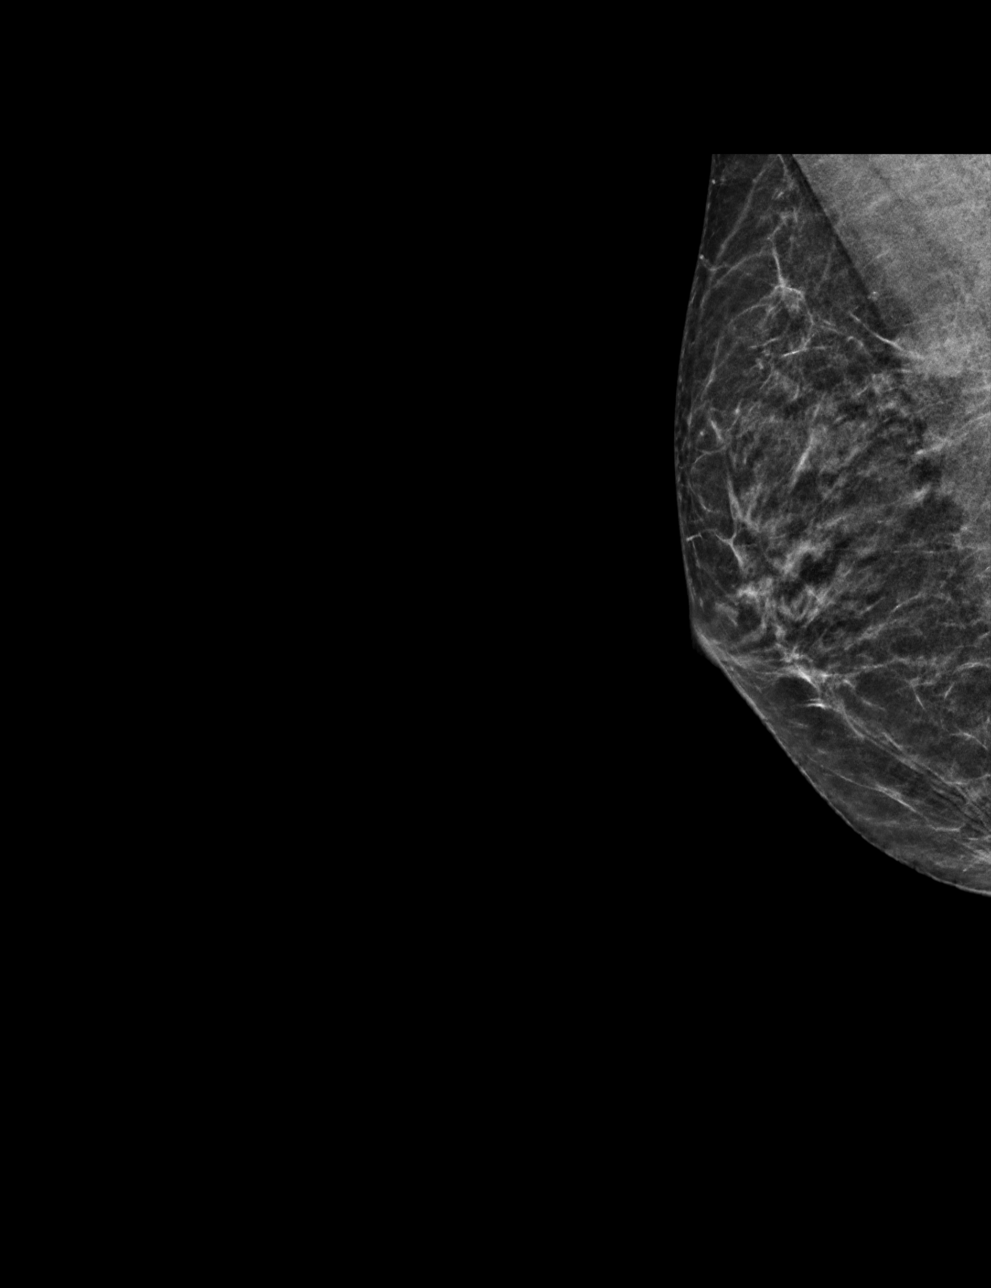

[R CC synth-2D]
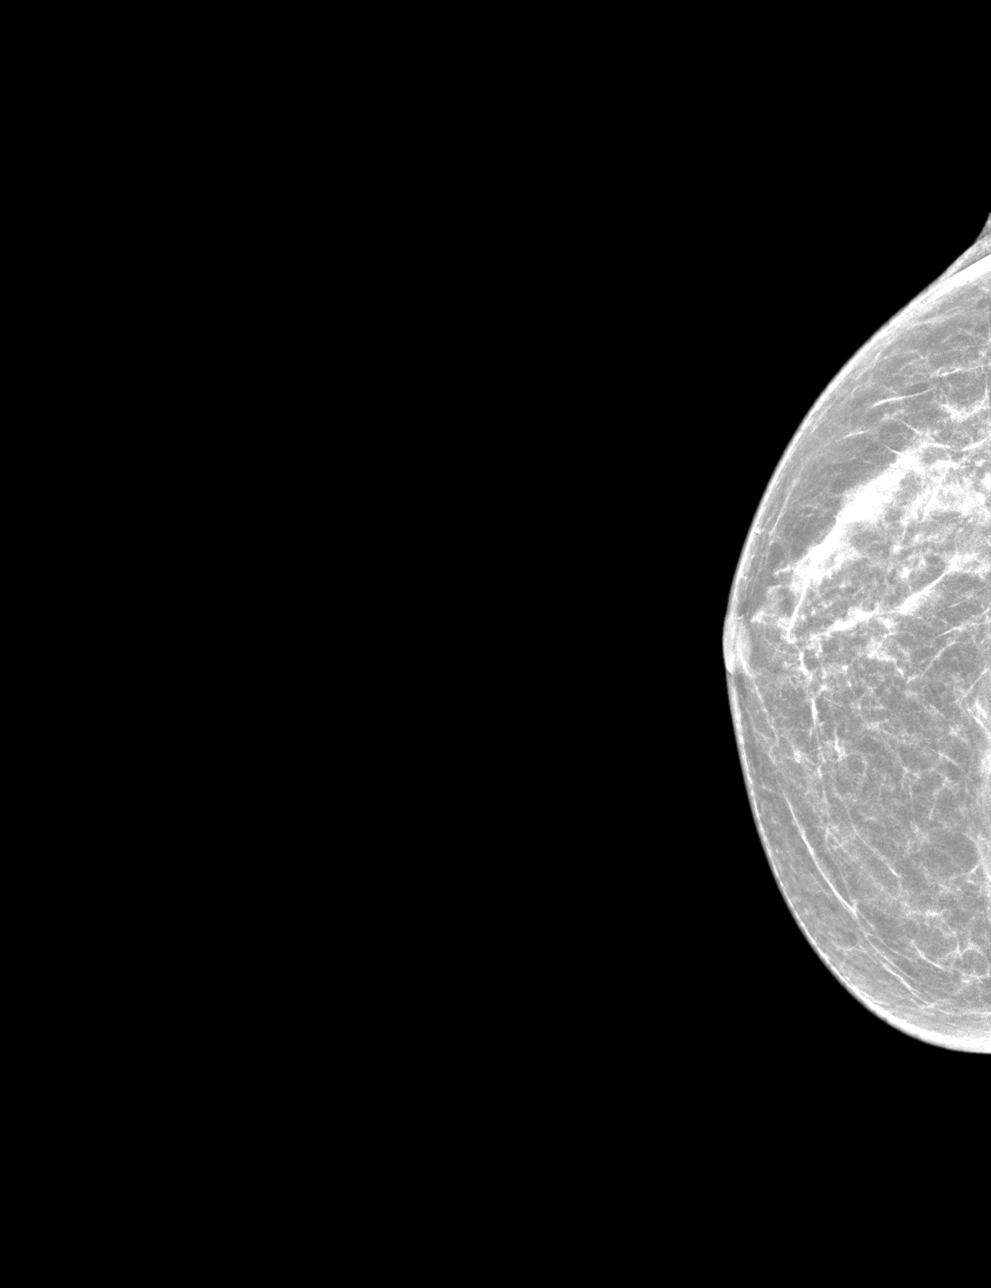

[R CC tomo · tomo slice 22/43.0]
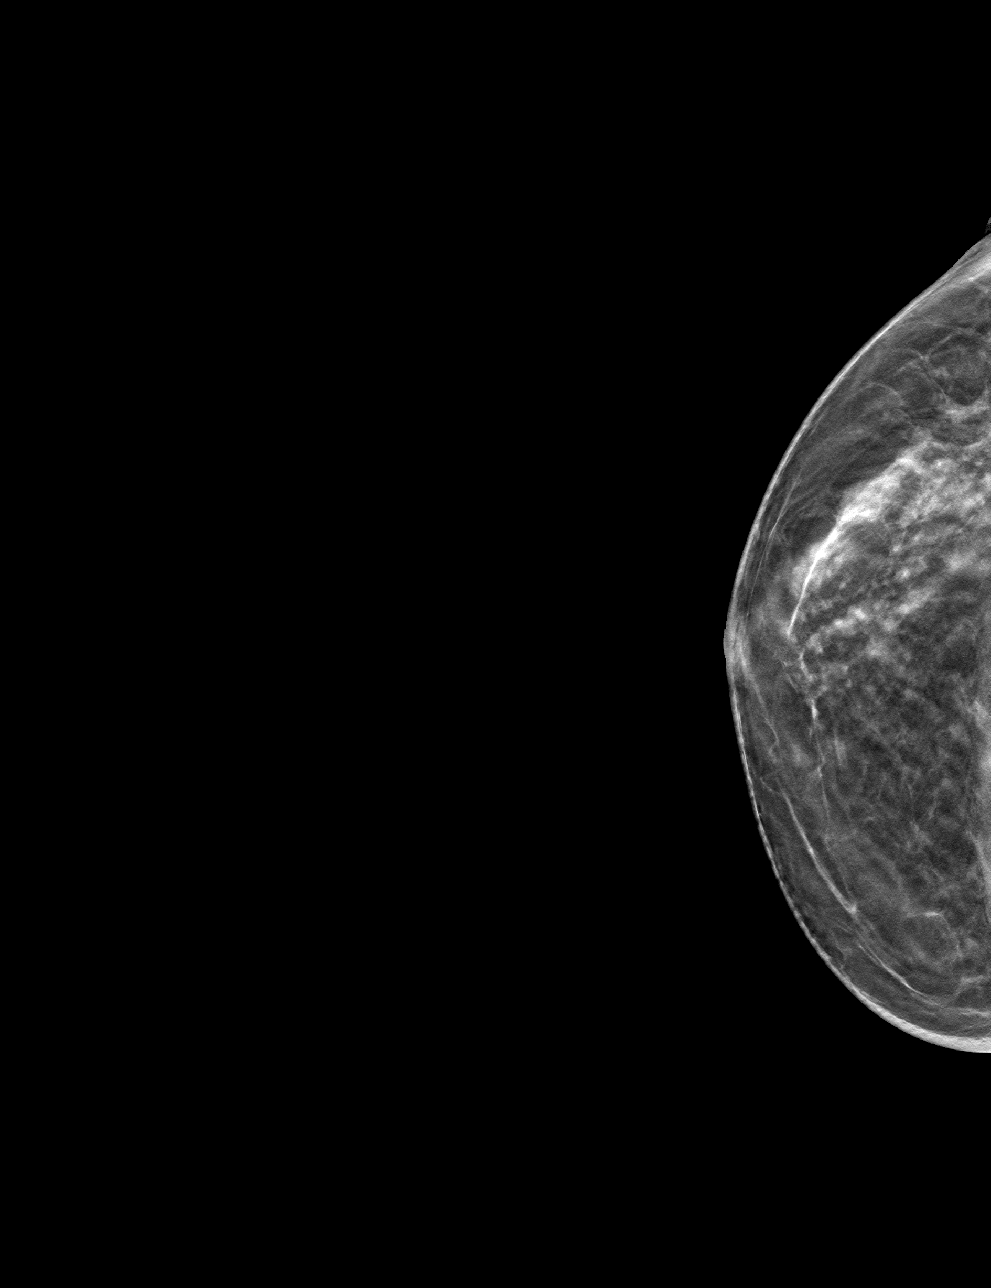

[R MLO tomo · tomo slice 20/39.0]
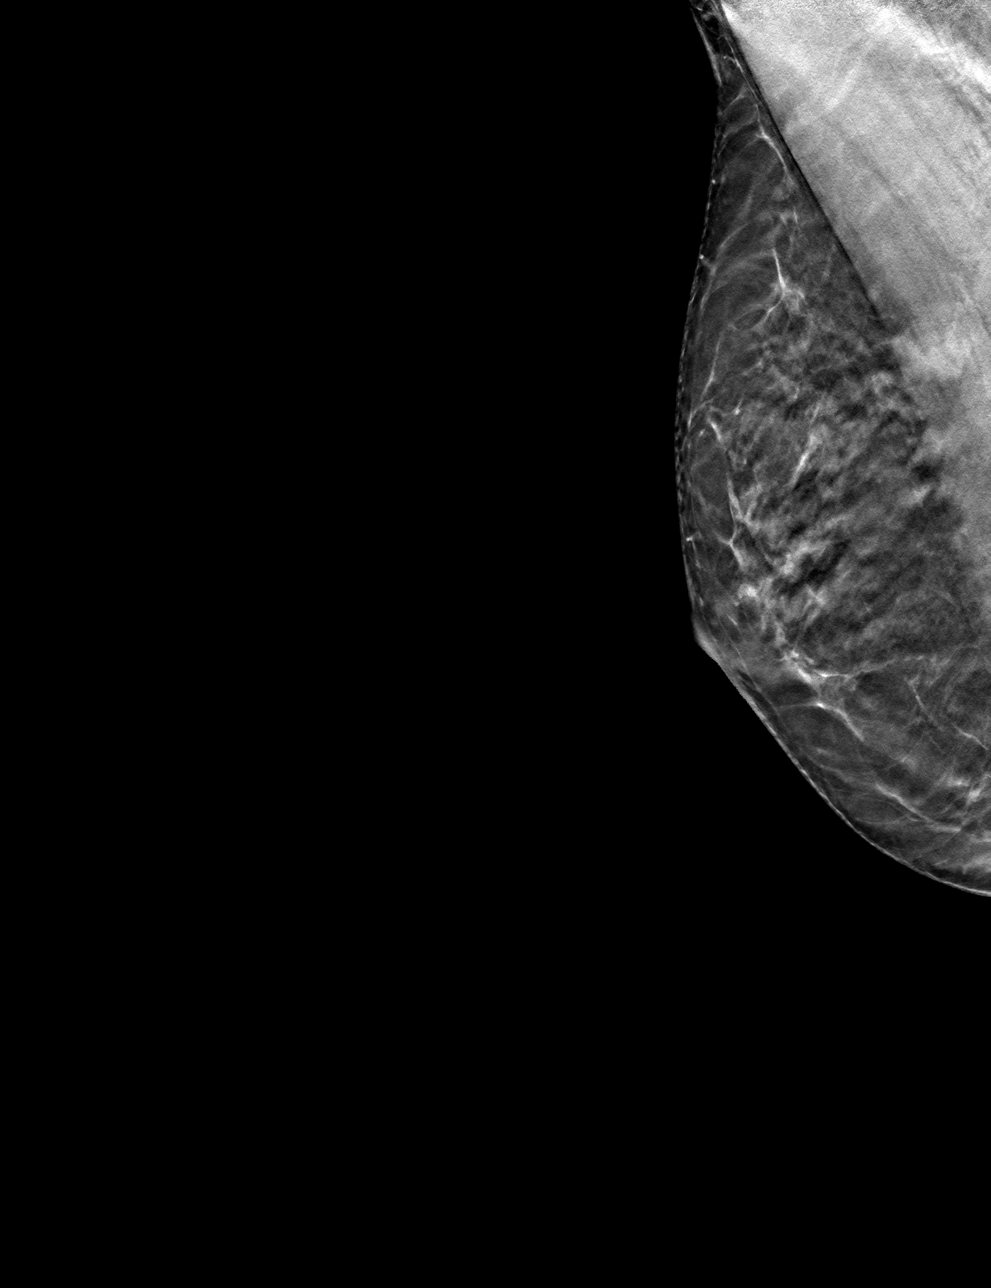

[4 of 12 positions shown; findings below may reference images not displayed]

ACR Breast Density Category b: There are scattered areas of
fibroglandular density.
FINDINGS: Stable asymmetric fibroglandular tissue overlying the pectoralis
muscle in the superior right breast on the MLO view. No mass,
architectural distortion, or suspicious microcalcification in the
right breast.

Mammographic images were processed with CAD.
IMPRESSION: Stable probably benign asymmetry seen in the MLO projection of the
right breast.

RECOMMENDATION:
Bilateral diagnostic mammogram is recommended in February 2019.

I have discussed the findings and recommendations with the patient.
Results were also provided in writing at the conclusion of the
visit. If applicable, a reminder letter will be sent to the patient
regarding the next appointment.

BI-RADS CATEGORY  3: Probably benign.

## 2021-03-13 ENCOUNTER — Encounter: Payer: 59 | Admitting: Family Medicine

## 2021-03-28 ENCOUNTER — Encounter: Payer: Self-pay | Admitting: Registered Nurse

## 2021-03-28 ENCOUNTER — Other Ambulatory Visit: Payer: Self-pay

## 2021-03-28 ENCOUNTER — Ambulatory Visit (INDEPENDENT_AMBULATORY_CARE_PROVIDER_SITE_OTHER): Payer: 59 | Admitting: Registered Nurse

## 2021-03-28 VITALS — BP 126/64 | HR 70 | Temp 98.4°F | Resp 18 | Ht 63.0 in | Wt 113.0 lb

## 2021-03-28 DIAGNOSIS — Z0000 Encounter for general adult medical examination without abnormal findings: Secondary | ICD-10-CM

## 2021-03-28 DIAGNOSIS — Z1329 Encounter for screening for other suspected endocrine disorder: Secondary | ICD-10-CM | POA: Diagnosis not present

## 2021-03-28 DIAGNOSIS — Z13228 Encounter for screening for other metabolic disorders: Secondary | ICD-10-CM

## 2021-03-28 DIAGNOSIS — Z13220 Encounter for screening for lipoid disorders: Secondary | ICD-10-CM

## 2021-03-28 DIAGNOSIS — Z130 Encounter for screening for diseases of the blood and blood-forming organs and certain disorders involving the immune mechanism: Secondary | ICD-10-CM

## 2021-03-28 LAB — LIPID PANEL
Cholesterol: 253 mg/dL — ABNORMAL HIGH (ref 0–200)
HDL: 101.3 mg/dL (ref >39.00)
LDL Cholesterol: 140 mg/dL — ABNORMAL HIGH (ref 0–99)
NonHDL: 151.88
Total CHOL/HDL Ratio: 2
Triglycerides: 58 mg/dL (ref 0.0–149.0)
VLDL: 11.6 mg/dL (ref 0.0–40.0)

## 2021-03-28 LAB — COMPREHENSIVE METABOLIC PANEL
ALT: 13 U/L (ref 0–35)
AST: 18 U/L (ref 0–37)
Albumin: 4.3 g/dL (ref 3.5–5.2)
Alkaline Phosphatase: 71 U/L (ref 39–117)
BUN: 10 mg/dL (ref 6–23)
CO2: 30 mEq/L (ref 19–32)
Calcium: 9.5 mg/dL (ref 8.4–10.5)
Chloride: 101 mEq/L (ref 96–112)
Creatinine, Ser: 0.68 mg/dL (ref 0.40–1.20)
GFR: 90.2 mL/min (ref >60.00)
Glucose, Bld: 94 mg/dL (ref 70–99)
Potassium: 4.4 mEq/L (ref 3.5–5.1)
Sodium: 139 mEq/L (ref 135–145)
Total Bilirubin: 0.8 mg/dL (ref 0.2–1.2)
Total Protein: 7 g/dL (ref 6.0–8.3)

## 2021-03-28 LAB — CBC WITH DIFFERENTIAL/PLATELET
Basophils Absolute: 0 10*3/uL (ref 0.0–0.1)
Basophils Relative: 1 % (ref 0.0–3.0)
Eosinophils Absolute: 0.1 10*3/uL (ref 0.0–0.7)
Eosinophils Relative: 3.2 % (ref 0.0–5.0)
HCT: 36.6 % (ref 36.0–46.0)
Hemoglobin: 11.9 g/dL — ABNORMAL LOW (ref 12.0–15.0)
Lymphocytes Relative: 41.7 % (ref 12.0–46.0)
Lymphs Abs: 1.8 10*3/uL (ref 0.7–4.0)
MCHC: 32.4 g/dL (ref 30.0–36.0)
MCV: 85.2 fl (ref 78.0–100.0)
Monocytes Absolute: 0.3 10*3/uL (ref 0.1–1.0)
Monocytes Relative: 7.2 % (ref 3.0–12.0)
Neutro Abs: 2 10*3/uL (ref 1.4–7.7)
Neutrophils Relative %: 46.9 % (ref 43.0–77.0)
Platelets: 309 10*3/uL (ref 150.0–400.0)
RBC: 4.3 Mil/uL (ref 3.87–5.11)
RDW: 12.3 % (ref 11.5–15.5)
WBC: 4.3 10*3/uL (ref 4.0–10.5)

## 2021-03-28 LAB — HEMOGLOBIN A1C: Hgb A1c MFr Bld: 5.3 % (ref 4.6–6.5)

## 2021-03-28 LAB — TSH: TSH: 1.91 u[IU]/mL (ref 0.35–5.50)

## 2021-03-28 NOTE — Progress Notes (Signed)
Established Patient Office Visit  Subjective:  Patient ID: Tamara Cole, female    DOB: 28-Jun-1955  Age: 65 y.o. MRN: [id number]  CC:  Chief Complaint  Patient presents with   Annual Exam    Patient states she is here for a physical and discuss a form.    HPI Tamara Cole presents for CPE  No acute concerns  Histories reviewed and updated with patient.    Past Medical History:  Diagnosis Date   Allergy     Past Surgical History:  Procedure Laterality Date   ABDOMINAL HYSTERECTOMY      Family History  Problem Relation Age of Onset   Diabetes Mother     Social History   Socioeconomic History   Marital status: Single    Spouse name: Not on file   Number of children: Not on file   Years of education: Not on file   Highest education level: Not on file  Occupational History   Not on file  Tobacco Use   Smoking status: Never   Smokeless tobacco: Never  Vaping Use   Vaping Use: Never used  Substance and Sexual Activity   Alcohol use: No   Drug use: No   Sexual activity: Not Currently  Other Topics Concern   Not on file  Social History Narrative   Not on file   Social Determinants of Health   Financial Resource Strain: Not on file  Food Insecurity: Not on file  Transportation Needs: Not on file  Physical Activity: Not on file  Stress: Not on file  Social Connections: Not on file  Intimate Partner Violence: Not on file    Outpatient Medications Prior to Visit  Medication Sig Dispense Refill   azithromycin (ZITHROMAX) 250 MG tablet Take 2 pills by mouth on day 1, then 1 pill by mouth per day on days 2 through 5. (Patient not taking: Reported on 03/28/2021) 6 tablet 0   benzonatate (TESSALON) 100 MG capsule Take 1 capsule (100 mg total) by mouth 3 (three) times daily as needed for cough. (Patient not taking: Reported on 03/28/2021) 20 capsule 0   No facility-administered medications prior to visit.    No Known Allergies  ROS Review of  Systems  Constitutional: Negative.   HENT: Negative.    Eyes: Negative.   Respiratory: Negative.    Cardiovascular: Negative.   Gastrointestinal: Negative.   Genitourinary: Negative.   Musculoskeletal: Negative.   Skin: Negative.   Neurological: Negative.   Psychiatric/Behavioral: Negative.    All other systems reviewed and are negative.    Objective:    Physical Exam Vitals and nursing note reviewed.  Constitutional:      General: She is not in acute distress.    Appearance: Normal appearance. She is normal weight. She is not ill-appearing, toxic-appearing or diaphoretic.  HENT:     Head: Normocephalic and atraumatic.     Right Ear: Tympanic membrane, ear canal and external ear normal. There is no impacted cerumen.     Left Ear: Tympanic membrane, ear canal and external ear normal. There is no impacted cerumen.     Nose: Nose normal. No congestion or rhinorrhea.     Mouth/Throat:     Mouth: Mucous membranes are moist.     Pharynx: Oropharynx is clear. No oropharyngeal exudate or posterior oropharyngeal erythema.  Eyes:     General: No scleral icterus.       Right eye: No discharge.        Left eye:  No discharge.     Extraocular Movements: Extraocular movements intact.     Conjunctiva/sclera: Conjunctivae normal.     Pupils: Pupils are equal, round, and reactive to light.  Cardiovascular:     Rate and Rhythm: Normal rate and regular rhythm.     Pulses: Normal pulses.     Heart sounds: Normal heart sounds. No murmur heard.   No friction rub. No gallop.  Pulmonary:     Effort: Pulmonary effort is normal. No respiratory distress.     Breath sounds: Normal breath sounds. No stridor. No wheezing, rhonchi or rales.  Chest:     Chest wall: No tenderness.  Abdominal:     General: Abdomen is flat. Bowel sounds are normal. There is no distension.     Palpations: Abdomen is soft. There is no mass.     Tenderness: There is no abdominal tenderness. There is no right CVA  tenderness, left CVA tenderness, guarding or rebound.     Hernia: No hernia is present.  Musculoskeletal:        General: No swelling, tenderness, deformity or signs of injury. Normal range of motion.     Right lower leg: No edema.     Left lower leg: No edema.  Skin:    General: Skin is warm and dry.     Capillary Refill: Capillary refill takes less than 2 seconds.     Coloration: Skin is not jaundiced or pale.     Findings: No bruising, erythema, lesion or rash.  Neurological:     General: No focal deficit present.     Mental Status: She is alert and oriented to person, place, and time. Mental status is at baseline.     Cranial Nerves: No cranial nerve deficit.     Sensory: No sensory deficit.     Motor: No weakness.     Coordination: Coordination normal.     Gait: Gait normal.     Deep Tendon Reflexes: Reflexes normal.  Psychiatric:        Mood and Affect: Mood normal.        Behavior: Behavior normal.        Thought Content: Thought content normal.        Judgment: Judgment normal.    BP 126/64   Pulse 70   Temp 98.4 F (36.9 C) (Temporal)   Resp 18   Ht 5\' 3"  (1.6 m)   Wt 113 lb (51.3 kg)   SpO2 99%   BMI 20.02 kg/m  Wt Readings from Last 3 Encounters:  03/28/21 113 lb (51.3 kg)  07/06/20 110 lb (49.9 kg)  07/01/20 105 lb (47.6 kg)     Health Maintenance Due  Topic Date Due   MAMMOGRAM  03/11/2021    There are no preventive care reminders to display for this patient.  Lab Results  Component Value Date   TSH 1.870 07/06/2020   Lab Results  Component Value Date   WBC 4.3 07/06/2020   HGB 11.4 07/06/2020   HCT 35.7 07/06/2020   MCV 85 07/06/2020   PLT 352 07/06/2020   Lab Results  Component Value Date   NA 142 07/06/2020   K 4.4 07/06/2020   CO2 25 07/06/2020   GLUCOSE 89 07/06/2020   BUN 9 07/06/2020   CREATININE 0.60 07/06/2020   BILITOT 0.8 03/11/2020   ALKPHOS 108 03/11/2020   AST 29 03/11/2020   ALT 35 (H) 03/11/2020   PROT 7.4  03/11/2020   ALBUMIN 4.5 03/11/2020  CALCIUM 9.3 07/06/2020   Lab Results  Component Value Date   CHOL 254 (H) 03/11/2020   Lab Results  Component Value Date   HDL 101 03/11/2020   Lab Results  Component Value Date   LDLCALC 145 (H) 03/11/2020   Lab Results  Component Value Date   TRIG 51 03/11/2020   Lab Results  Component Value Date   CHOLHDL 2.5 03/11/2020   Lab Results  Component Value Date   HGBA1C 5.5 03/11/2020      Assessment & Plan:   Problem List Items Addressed This Visit   None Visit Diagnoses     Annual physical exam    -  Primary   Screening for endocrine, metabolic and immunity disorder       Relevant Orders   CBC with Differential/Platelet   Comprehensive metabolic panel   Hemoglobin A1c   TSH   Lipid screening       Relevant Orders   Lipid panel       No orders of the defined types were placed in this encounter.   Follow-up: Return in about 1 year (around 03/28/2022) for CPE and labs.   PLAN Exam unremarkable Labs collected. Will follow up with the patient as warranted. Return annually Paperwork returned to patient Patient encouraged to call clinic with any questions, comments, or concerns.  Janeece Agee, NP

## 2021-03-28 NOTE — Patient Instructions (Addendum)
[name] -   Tamara Cole to see you  No concerns on physical.  Let's see how labs look  Thanks,  Rich     If you have lab work done today you will be contacted with your lab results within the next 2 weeks.  If you have not heard from [place] then please contact us. The fastest way to get your results is to register for My Chart.   IF you received an x-ray today, you will receive an invoice from [facility]. Please contact [facility] at [phone] with questions or concerns regarding your invoice.   IF you received labwork today, you will receive an invoice from [place]. Please contact [facility] at [phone] with questions or concerns regarding your invoice.   Our billing staff will not be able to assist you with questions regarding bills from these companies.  You will be contacted with the lab results as soon as they are available. The fastest way to get your results is to activate your My Chart account. Instructions are located on the last page of this paperwork. If you have not heard from [place] regarding the results in 2 weeks, please contact this office.

## 2022-02-26 ENCOUNTER — Encounter: Payer: Self-pay | Admitting: Family Medicine

## 2022-02-26 ENCOUNTER — Ambulatory Visit (INDEPENDENT_AMBULATORY_CARE_PROVIDER_SITE_OTHER): Payer: 59 | Admitting: Family Medicine

## 2022-02-26 VITALS — BP 122/68 | HR 58 | Temp 98.1°F | Ht 63.0 in | Wt 115.0 lb

## 2022-02-26 DIAGNOSIS — D649 Anemia, unspecified: Secondary | ICD-10-CM | POA: Diagnosis not present

## 2022-02-26 DIAGNOSIS — Z1231 Encounter for screening mammogram for malignant neoplasm of breast: Secondary | ICD-10-CM | POA: Diagnosis not present

## 2022-02-26 DIAGNOSIS — Z13820 Encounter for screening for osteoporosis: Secondary | ICD-10-CM

## 2022-02-26 DIAGNOSIS — E785 Hyperlipidemia, unspecified: Secondary | ICD-10-CM

## 2022-02-26 DIAGNOSIS — E2839 Other primary ovarian failure: Secondary | ICD-10-CM

## 2022-02-26 DIAGNOSIS — Z0000 Encounter for general adult medical examination without abnormal findings: Secondary | ICD-10-CM

## 2022-02-26 DIAGNOSIS — Z1211 Encounter for screening for malignant neoplasm of colon: Secondary | ICD-10-CM

## 2022-02-26 LAB — LIPID PANEL
Cholesterol: 250 mg/dL — ABNORMAL HIGH (ref 0–200)
HDL: 89.4 mg/dL (ref >39.00)
LDL Cholesterol: 143 mg/dL — ABNORMAL HIGH (ref 0–99)
NonHDL: 160.62
Total CHOL/HDL Ratio: 3
Triglycerides: 86 mg/dL (ref 0.0–149.0)
VLDL: 17.2 mg/dL (ref 0.0–40.0)

## 2022-02-26 LAB — COMPREHENSIVE METABOLIC PANEL WITH GFR
ALT: 34 U/L (ref 0–35)
AST: 28 U/L (ref 0–37)
Albumin: 4.1 g/dL (ref 3.5–5.2)
Alkaline Phosphatase: 81 U/L (ref 39–117)
BUN: 14 mg/dL (ref 6–23)
CO2: 30 meq/L (ref 19–32)
Calcium: 9.7 mg/dL (ref 8.4–10.5)
Chloride: 102 meq/L (ref 96–112)
Creatinine, Ser: 0.63 mg/dL (ref 0.40–1.20)
GFR: 91.28 mL/min
Glucose, Bld: 88 mg/dL (ref 70–99)
Potassium: 4.5 meq/L (ref 3.5–5.1)
Sodium: 138 meq/L (ref 135–145)
Total Bilirubin: 0.4 mg/dL (ref 0.2–1.2)
Total Protein: 7.5 g/dL (ref 6.0–8.3)

## 2022-02-26 LAB — CBC
HCT: 36.7 % (ref 36.0–46.0)
Hemoglobin: 12 g/dL (ref 12.0–15.0)
MCHC: 32.8 g/dL (ref 30.0–36.0)
MCV: 84.4 fl (ref 78.0–100.0)
Platelets: 329 10*3/uL (ref 150.0–400.0)
RBC: 4.35 Mil/uL (ref 3.87–5.11)
RDW: 12.6 % (ref 11.5–15.5)
WBC: 4.4 10*3/uL (ref 4.0–10.5)

## 2022-02-26 NOTE — Patient Instructions (Addendum)
I will order the cologuard  - if not received in next 2 weeks let me know.  I will order bone density, but let them know about when you need to schedule your appointment. Mammogram also ordered - they may need to check a diagnostic mammogram - make sure to discuss the costs of that testing prior to having studies done.  If any other concerns on labs I will let you know.  I think you are doing well.  Thank you for coming in today and take care.  Let me know if there are questions, otherwise I will see you in 1 year for physical.  Preventive Care 65 Years and Older, Female Preventive care refers to lifestyle choices and visits with your health care provider that can promote health and wellness. Preventive care visits are also called wellness exams. What can I expect for my preventive care visit? Counseling Your health care provider may ask you questions about your: Medical history, including: Past medical problems. Family medical history. Pregnancy and menstrual history. History of falls. Current health, including: Memory and ability to understand (cognition). Emotional well-being. Home life and relationship well-being. Sexual activity and sexual health. Lifestyle, including: Alcohol, nicotine or tobacco, and drug use. Access to firearms. Diet, exercise, and sleep habits. Work and work Statistician. Sunscreen use. Safety issues such as seatbelt and bike helmet use. Physical exam Your health care provider will check your: Height and weight. These may be used to calculate your BMI (body mass index). BMI is a measurement that tells if you are at a healthy weight. Waist circumference. This measures the distance around your waistline. This measurement also tells if you are at a healthy weight and may help predict your risk of certain diseases, such as type 2 diabetes and high blood pressure. Heart rate and blood pressure. Body temperature. Skin for abnormal spots. What immunizations do I  need?  Vaccines are usually given at various ages, according to a schedule. Your health care provider will recommend vaccines for you based on your age, medical history, and lifestyle or other factors, such as travel or where you work. What tests do I need? Screening Your health care provider may recommend screening tests for certain conditions. This may include: Lipid and cholesterol levels. Hepatitis C test. Hepatitis B test. HIV (human immunodeficiency virus) test. STI (sexually transmitted infection) testing, if you are at risk. Lung cancer screening. Colorectal cancer screening. Diabetes screening. This is done by checking your blood sugar (glucose) after you have not eaten for a while (fasting). Mammogram. Talk with your health care provider about how often you should have regular mammograms. BRCA-related cancer screening. This may be done if you have a family history of breast, ovarian, tubal, or peritoneal cancers. Bone density scan. This is done to screen for osteoporosis. Talk with your health care provider about your test results, treatment options, and if necessary, the need for more tests. Follow these instructions at home: Eating and drinking  Eat a diet that includes fresh fruits and vegetables, whole grains, lean protein, and low-fat dairy products. Limit your intake of foods with high amounts of sugar, saturated fats, and salt. Take vitamin and mineral supplements as recommended by your health care provider. Do not drink alcohol if your health care provider tells you not to drink. If you drink alcohol: Limit how much you have to 0-1 drink a day. Know how much alcohol is in your drink. In the [place] one drink equals one 12 oz bottle of beer (355  mL), one 5 oz glass of wine (148 mL), or one 1 oz glass of hard liquor (44 mL). Lifestyle Brush your teeth every morning and night with fluoride toothpaste. Floss one time each day. Exercise for at least 30 minutes 5 or more days  each week. Do not use any products that contain nicotine or tobacco. These products include cigarettes, chewing tobacco, and vaping devices, such as e-cigarettes. If you need help quitting, ask your health care provider. Do not use drugs. If you are sexually active, practice safe sex. Use a condom or other form of protection in order to prevent STIs. Take aspirin only as told by your health care provider. Make sure that you understand how much to take and what form to take. Work with your health care provider to find out whether it is safe and beneficial for you to take aspirin daily. Ask your health care provider if you need to take a cholesterol-lowering medicine (statin). Find healthy ways to manage stress, such as: Meditation, yoga, or listening to music. Journaling. Talking to a trusted person. Spending time with friends and family. Minimize exposure to UV radiation to reduce your risk of skin cancer. Safety Always wear your seat belt while driving or riding in a vehicle. Do not drive: If you have been drinking alcohol. Do not ride with someone who has been drinking. When you are tired or distracted. While texting. If you have been using any mind-altering substances or drugs. Wear a helmet and other protective equipment during sports activities. If you have firearms in your house, make sure you follow all gun safety procedures. What's next? Visit your health care provider once a year for an annual wellness visit. Ask your health care provider how often you should have your eyes and teeth checked. Stay up to date on all vaccines. This information is not intended to replace advice given to you by your health care provider. Make sure you discuss any questions you have with your health care provider. Document Revised: 11/30/2020 Document Reviewed: 11/30/2020 Elsevier Patient Education  [place].

## 2022-02-26 NOTE — Progress Notes (Signed)
Subjective:  Patient ID: Tamara Cole, female    DOB: [birth year]  Age: 66 y.o. MRN: [id number]  CC:  Chief Complaint  Patient presents with   Annual Exam    Pt states all is ok    HPI Tamara Cole presents for Annual Exam  No health changes since last visit.  Borderline HGB 11.9 in 03/2021.   Hyperlipidemia: No meds, diet/exercise approach. Great HDL.  The ASCVD Risk score (Arnett DK, et al., 2019) failed to calculate for the following reasons:   The valid HDL cholesterol range is 20 to 100 mg/dL  Lab Results  Component Value Date   CHOL 253 (H) 03/28/2021   HDL 101.30 03/28/2021   LDLCALC 140 (H) 03/28/2021   TRIG 58.0 03/28/2021   CHOLHDL 2 03/28/2021   Lab Results  Component Value Date   ALT 13 03/28/2021   AST 18 03/28/2021   ALKPHOS 71 03/28/2021   BILITOT 0.8 03/28/2021        02/26/2022   11:33 AM 03/28/2021    1:50 PM 07/06/2020   11:39 AM 07/01/2020   12:38 PM 06/27/2020    1:26 PM  Depression screen PHQ 2/9  Decreased Interest 0 0 0 0 0  Down, Depressed, Hopeless 0 0 0 0 0  PHQ - 2 Score 0 0 0 0 0  Altered sleeping 0 0     Tired, decreased energy 0 0     Change in appetite 0 0     Feeling bad or failure about yourself  0 0     Trouble concentrating 0 0     Moving slowly or fidgety/restless 0 0     Suicidal thoughts 0 0     PHQ-9 Score 0 0     Difficult doing work/chores  Not difficult at all       Health Maintenance  Topic Date Due   COVID-19 Vaccine (1) 03/14/2022 (Originally 06/09/1956)   TETANUS/TDAP  03/28/2022 (Originally 03/07/2021)   Fecal DNA (Cologuard)  03/28/2022 (Originally 03/15/2021)   Zoster Vaccines- Shingrix (1 of 2) 05/28/2022 (Originally 12/09/2003)   Pneumonia Vaccine 23+ Years old (1 - PCV) 02/27/2023 (Originally 12/09/2018)   MAMMOGRAM  02/27/2023 (Originally 03/11/2021)   DEXA SCAN  02/27/2023 (Originally 1956/06/17)   Hepatitis C Screening  Completed   HPV VACCINES  Aged Out   INFLUENZA VACCINE  Discontinued   Cologuard negative 03/15/18. Would like to repeat. Plans to schedule mammogram soon.  S/p hysterectomy for benign reasons.  Bone density - due, order placed.   Immunization History  Administered Date(s) Administered   Tdap 03/08/2011  Declines covid, pneumonia vaccines.  Declines flu vaccine.   No results found. Wears glasses. Saw optho recently.   Dental: appt soon. Regular care.   Alcohol:none  Tobacco: none  Exercise: physical job, running, tennis.    History There are no problems to display for this patient.  Past Medical History:  Diagnosis Date   Allergy    Past Surgical History:  Procedure Laterality Date   ABDOMINAL HYSTERECTOMY     No Known Allergies Prior to Admission medications   Medication Sig Start Date End Date Taking? Authorizing Provider  azithromycin (ZITHROMAX) 250 MG tablet Take 2 pills by mouth on day 1, then 1 pill by mouth per day on days 2 through 5. Patient not taking: Reported on 03/28/2021 06/27/20   Wendie Agreste, MD  benzonatate (TESSALON) 100 MG capsule Take 1 capsule (100 mg total) by mouth 3 (three) times daily as  needed for cough. Patient not taking: Reported on 03/28/2021 06/24/20   Wendie Agreste, MD   Social History   Socioeconomic History   Marital status: Single    Spouse name: Not on file   Number of children: Not on file   Years of education: Not on file   Highest education level: Not on file  Occupational History   Not on file  Tobacco Use   Smoking status: Never   Smokeless tobacco: Never  Vaping Use   Vaping Use: Never used  Substance and Sexual Activity   Alcohol use: No   Drug use: No   Sexual activity: Not Currently  Other Topics Concern   Not on file  Social History Narrative   Not on file   Social Determinants of Health   Financial Resource Strain: Not on file  Food Insecurity: Not on file  Transportation Needs: Not on file  Physical Activity: Not on file  Stress: Not on file  Social  Connections: Not on file  Intimate Partner Violence: Not on file    Review of Systems 13 point review of systems per patient health survey noted.  Negative other than as indicated above or in HPI.    Objective:   Vitals:   02/26/22 1141  BP: 122/68  Pulse: (!) 58  Temp: 98.1 F (36.7 C)  SpO2: 99%  Weight: 115 lb (52.2 kg)  Height: $Remove'5\' 3"'wIiNmzg$  (1.6 m)     Physical Exam Constitutional:      Appearance: She is well-developed.  HENT:     Head: Normocephalic and atraumatic.     Right Ear: External ear normal.     Left Ear: External ear normal.  Eyes:     Conjunctiva/sclera: Conjunctivae normal.     Pupils: Pupils are equal, round, and reactive to light.  Neck:     Thyroid: No thyromegaly.  Cardiovascular:     Rate and Rhythm: Normal rate and regular rhythm.     Heart sounds: Normal heart sounds. No murmur heard. Pulmonary:     Effort: Pulmonary effort is normal. No respiratory distress.     Breath sounds: Normal breath sounds. No wheezing.  Abdominal:     General: Bowel sounds are normal.     Palpations: Abdomen is soft.     Tenderness: There is no abdominal tenderness.  Musculoskeletal:        General: No tenderness. Normal range of motion.     Cervical back: Normal range of motion and neck supple.  Lymphadenopathy:     Cervical: No cervical adenopathy.  Skin:    General: Skin is warm and dry.     Findings: No rash.  Neurological:     Mental Status: She is alert and oriented to person, place, and time.  Psychiatric:        Behavior: Behavior normal.        Thought Content: Thought content normal.        Assessment & Plan:  Tamara Cole is a 66 y.o. female . Annual physical exam - Plan: Comprehensive metabolic panel, Lipid panel  - -anticipatory guidance as below in AVS, screening labs above. Health maintenance items as above in HPI discussed/recommended as applicable.   Encounter for screening mammogram for malignant neoplasm of breast - Plan: MM  Digital Diagnostic Bilat  -Patient reviewed previous ultrasound, diagnostic mammogram needed for follow-up.  May need possible ultrasound.  There were some costs out-of-pocket last time she had studies, did express some concerns regarding these costs.  Advised that she discuss with imaging facility potential cost prior to the studies above.  Estrogen deficiency - Plan: DG Bone Density Screening for osteoporosis - Plan: DG Bone Density  Screen for colon cancer - Plan: Cologuard  Hyperlipidemia, unspecified hyperlipidemia type - Plan: Comprehensive metabolic panel, Lipid panel  -Elevated LDL, total but great HDL.  No meds for now.  Check labs.  Low hemoglobin - Plan: CBC Borderline hemoglobin previously, check CBC  No orders of the defined types were placed in this encounter.  Patient Instructions   I will order the cologuard  - if not received in next 2 weeks let me know.  I will order bone density, but let them know about when you need to schedule your appointment. Mammogram also ordered - they may need to check a diagnostic mammogram - make sure to discuss the costs of that testing prior to having studies done.  If any other concerns on labs I will let you know.  I think you are doing well.  Thank you for coming in today and take care.  Let me know if there are questions, otherwise I will see you in 1 year for physical.  Preventive Care 65 Years and Older, Female Preventive care refers to lifestyle choices and visits with your health care provider that can promote health and wellness. Preventive care visits are also called wellness exams. What can I expect for my preventive care visit? Counseling Your health care provider may ask you questions about your: Medical history, including: Past medical problems. Family medical history. Pregnancy and menstrual history. History of falls. Current health, including: Memory and ability to understand (cognition). Emotional well-being. Home  life and relationship well-being. Sexual activity and sexual health. Lifestyle, including: Alcohol, nicotine or tobacco, and drug use. Access to firearms. Diet, exercise, and sleep habits. Work and work Statistician. Sunscreen use. Safety issues such as seatbelt and bike helmet use. Physical exam Your health care provider will check your: Height and weight. These may be used to calculate your BMI (body mass index). BMI is a measurement that tells if you are at a healthy weight. Waist circumference. This measures the distance around your waistline. This measurement also tells if you are at a healthy weight and may help predict your risk of certain diseases, such as type 2 diabetes and high blood pressure. Heart rate and blood pressure. Body temperature. Skin for abnormal spots. What immunizations do I need?  Vaccines are usually given at various ages, according to a schedule. Your health care provider will recommend vaccines for you based on your age, medical history, and lifestyle or other factors, such as travel or where you work. What tests do I need? Screening Your health care provider may recommend screening tests for certain conditions. This may include: Lipid and cholesterol levels. Hepatitis C test. Hepatitis B test. HIV (human immunodeficiency virus) test. STI (sexually transmitted infection) testing, if you are at risk. Lung cancer screening. Colorectal cancer screening. Diabetes screening. This is done by checking your blood sugar (glucose) after you have not eaten for a while (fasting). Mammogram. Talk with your health care provider about how often you should have regular mammograms. BRCA-related cancer screening. This may be done if you have a family history of breast, ovarian, tubal, or peritoneal cancers. Bone density scan. This is done to screen for osteoporosis. Talk with your health care provider about your test results, treatment options, and if necessary, the need  for more tests. Follow these instructions at home: Eating and  drinking  Eat a diet that includes fresh fruits and vegetables, whole grains, lean protein, and low-fat dairy products. Limit your intake of foods with high amounts of sugar, saturated fats, and salt. Take vitamin and mineral supplements as recommended by your health care provider. Do not drink alcohol if your health care provider tells you not to drink. If you drink alcohol: Limit how much you have to 0-1 drink a day. Know how much alcohol is in your drink. In the [place] one drink equals one 12 oz bottle of beer (355 mL), one 5 oz glass of wine (148 mL), or one 1 oz glass of hard liquor (44 mL). Lifestyle Brush your teeth every morning and night with fluoride toothpaste. Floss one time each day. Exercise for at least 30 minutes 5 or more days each week. Do not use any products that contain nicotine or tobacco. These products include cigarettes, chewing tobacco, and vaping devices, such as e-cigarettes. If you need help quitting, ask your health care provider. Do not use drugs. If you are sexually active, practice safe sex. Use a condom or other form of protection in order to prevent STIs. Take aspirin only as told by your health care provider. Make sure that you understand how much to take and what form to take. Work with your health care provider to find out whether it is safe and beneficial for you to take aspirin daily. Ask your health care provider if you need to take a cholesterol-lowering medicine (statin). Find healthy ways to manage stress, such as: Meditation, yoga, or listening to music. Journaling. Talking to a trusted person. Spending time with friends and family. Minimize exposure to UV radiation to reduce your risk of skin cancer. Safety Always wear your seat belt while driving or riding in a vehicle. Do not drive: If you have been drinking alcohol. Do not ride with someone who has been drinking. When you are  tired or distracted. While texting. If you have been using any mind-altering substances or drugs. Wear a helmet and other protective equipment during sports activities. If you have firearms in your house, make sure you follow all gun safety procedures. What's next? Visit your health care provider once a year for an annual wellness visit. Ask your health care provider how often you should have your eyes and teeth checked. Stay up to date on all vaccines. This information is not intended to replace advice given to you by your health care provider. Make sure you discuss any questions you have with your health care provider. Document Revised: 11/30/2020 Document Reviewed: 11/30/2020 Elsevier Patient Education  [place],   Merri Ray, MD [place] 02/26/22 12:57 PM

## 2022-03-02 ENCOUNTER — Other Ambulatory Visit: Payer: Self-pay | Admitting: Family Medicine

## 2022-03-02 DIAGNOSIS — Z09 Encounter for follow-up examination after completed treatment for conditions other than malignant neoplasm: Secondary | ICD-10-CM

## 2022-03-02 DIAGNOSIS — Z1231 Encounter for screening mammogram for malignant neoplasm of breast: Secondary | ICD-10-CM

## 2022-03-02 DIAGNOSIS — N6489 Other specified disorders of breast: Secondary | ICD-10-CM

## 2022-03-30 LAB — COLOGUARD: COLOGUARD: NEGATIVE

## 2022-04-02 NOTE — Progress Notes (Signed)
Called pt and informed them of the results

## 2023-01-16 ENCOUNTER — Ambulatory Visit: Payer: 59 | Admitting: Family Medicine

## 2023-01-28 ENCOUNTER — Ambulatory Visit (INDEPENDENT_AMBULATORY_CARE_PROVIDER_SITE_OTHER): Payer: 59 | Admitting: Family Medicine

## 2023-01-28 VITALS — BP 122/64 | HR 67 | Temp 98.5°F | Ht 62.0 in | Wt 111.1 lb

## 2023-01-28 DIAGNOSIS — Z0000 Encounter for general adult medical examination without abnormal findings: Secondary | ICD-10-CM

## 2023-01-28 DIAGNOSIS — E785 Hyperlipidemia, unspecified: Secondary | ICD-10-CM

## 2023-01-28 NOTE — Progress Notes (Signed)
Subjective:  Patient ID: Tamara Cole, female    DOB: 07/11/1955  Age: 67 y.o. MRN: [id number]  CC:  Chief Complaint  Patient presents with   Annual Exam    Pt needs phys, pt reports her insurance goes by calendar year     HPI Tamara Cole presents for Annual Exam Doing well.  No health changes since last visit. Work has been hard, considering retiring.  Hyperlipidemia: Diet/exercise approach with great HDL previously, low 10-year ASCVD risk score around 6% last year. Lab Results  Component Value Date   CHOL 250 (H) 02/26/2022   HDL 89.40 02/26/2022   LDLCALC 143 (H) 02/26/2022   TRIG 86.0 02/26/2022   CHOLHDL 3 02/26/2022   Lab Results  Component Value Date   ALT 34 02/26/2022   AST 28 02/26/2022   ALKPHOS 81 02/26/2022   BILITOT 0.4 02/26/2022          01/28/2023    3:00 PM 02/26/2022   11:33 AM 03/28/2021    1:50 PM 07/06/2020   11:39 AM 07/01/2020   12:38 PM  Depression screen PHQ 2/9  Decreased Interest 0 0 0 0 0  Down, Depressed, Hopeless 0 0 0 0 0  PHQ - 2 Score 0 0 0 0 0  Altered sleeping 0 0 0    Tired, decreased energy 0 0 0    Change in appetite 0 0 0    Feeling bad or failure about yourself  0 0 0    Trouble concentrating 0 0 0    Moving slowly or fidgety/restless 0 0 0    Suicidal thoughts 0 0 0    PHQ-9 Score 0 0 0    Difficult doing work/chores   Not difficult at all      Health Maintenance  Topic Date Due   Zoster Vaccines- Shingrix (1 of 2) Never done   DTaP/Tdap/Td (2 - Td or Tdap) 03/07/2021   COVID-19 Vaccine (1 - 2023-24 season) Never done   Pneumonia Vaccine 73+ Years old (1 of 1 - PCV) 02/27/2023 (Originally 12/09/2018)   MAMMOGRAM  02/27/2023 (Originally 03/11/2021)   DEXA SCAN  02/27/2023 (Originally Jul 04, 1955)   Fecal DNA (Cologuard)  03/24/2025   Hepatitis C Screening  Completed   HPV VACCINES  Aged Out   INFLUENZA VACCINE  Discontinued  Colon cancer screening, Cologuard negative 03/24/2022.  Repeat 3  years Breast cancer screening, mammogram ordered last September as she had not had a mammogram since 2020, at that time had a stable appearance of probably asymmetric fibroglandular tissue in the superior portion of the right breast.  Repeat diagnostic mammogram and possible right breast ultrasound recommended in 1 year.  Has not had mammogram/ultrasound, or bone density that was ordered last year. Does not want to return to same imaging location. Would like to go to facility in [facility] where   Immunization History  Administered Date(s) Administered   Tdap 03/08/2011  Declines tetanus update.  Pneumonia, shingles, flu, covid vaccines declined.   No results found. Wears glasses. Recent optho visit.   Dental: scheduled.   Alcohol: none  Tobacco: none  Exercise: physical work, up ladder, 5 days per week.    History There are no problems to display for this patient.  Past Medical History:  Diagnosis Date   Allergy    Past Surgical History:  Procedure Laterality Date   ABDOMINAL HYSTERECTOMY     No Known Allergies Prior to Admission medications   Medication Sig Start Date End  Date Taking? Authorizing Provider  azithromycin (ZITHROMAX) 250 MG tablet Take 2 pills by mouth on day 1, then 1 pill by mouth per day on days 2 through 5. Patient not taking: Reported on 03/28/2021 06/27/20   Shade Flood, MD  benzonatate (TESSALON) 100 MG capsule Take 1 capsule (100 mg total) by mouth 3 (three) times daily as needed for cough. Patient not taking: Reported on 03/28/2021 06/24/20   Shade Flood, MD   Social History   Socioeconomic History   Marital status: Single    Spouse name: Not on file   Number of children: Not on file   Years of education: Not on file   Highest education level: Some college, no degree  Occupational History   Not on file  Tobacco Use   Smoking status: Never   Smokeless tobacco: Never  Vaping Use   Vaping status: Never Used  Substance and Sexual  Activity   Alcohol use: No   Drug use: No   Sexual activity: Not Currently  Other Topics Concern   Not on file  Social History Narrative   Not on file   Social Determinants of Health   Financial Resource Strain: Low Risk  (01/28/2023)   Overall Financial Resource Strain (CARDIA)    Difficulty of Paying Living Expenses: Not hard at all  Food Insecurity: No Food Insecurity (01/28/2023)   Hunger Vital Sign    Worried About Running Out of Food in the Last Year: Never true    Ran Out of Food in the Last Year: Never true  Transportation Needs: No Transportation Needs (01/28/2023)   PRAPARE - Administrator, Civil Service (Medical): No    Lack of Transportation (Non-Medical): No  Physical Activity: Sufficiently Active (01/28/2023)   Exercise Vital Sign    Days of Exercise per Week: 5 days    Minutes of Exercise per Session: 70 min  Stress: No Stress Concern Present (01/28/2023)   [employer] of Occupational Health - Occupational Stress Questionnaire    Feeling of Stress : Not at all  Social Connections: Moderately Isolated (01/28/2023)   Social Connection and Isolation Panel [NHANES]    Frequency of Communication with Friends and Family: More than three times a week    Frequency of Social Gatherings with Friends and Family: Once a week    Attends Religious Services: 1 to 4 times per year    Active Member of [employer] or Organizations: No    Attends Engineer, structural: Not on file    Marital Status: Divorced  Intimate Partner Violence: Not on file    Review of Systems 13 point review of systems per patient health survey noted.  Negative other than as indicated above or in HPI.    Objective:   Vitals:   01/28/23 1501  BP: 122/64  Pulse: 67  Temp: 98.5 F (36.9 C)  TempSrc: Temporal  SpO2: 97%  Weight: 111 lb 1.3 oz (50.4 kg)  Height: 5\' 2"  (1.575 m)     Physical Exam Vitals reviewed.  Constitutional:      Appearance: She is well-developed.   HENT:     Head: Normocephalic and atraumatic.     Right Ear: External ear normal.     Left Ear: External ear normal.  Eyes:     Conjunctiva/sclera: Conjunctivae normal.     Pupils: Pupils are equal, round, and reactive to light.  Neck:     Thyroid: No thyromegaly.  Cardiovascular:     Rate  and Rhythm: Normal rate and regular rhythm.     Heart sounds: Normal heart sounds. No murmur heard. Pulmonary:     Effort: Pulmonary effort is normal. No respiratory distress.     Breath sounds: Normal breath sounds. No wheezing.  Abdominal:     General: Bowel sounds are normal.     Palpations: Abdomen is soft.     Tenderness: There is no abdominal tenderness.  Musculoskeletal:        General: No tenderness. Normal range of motion.     Cervical back: Normal range of motion and neck supple.  Lymphadenopathy:     Cervical: No cervical adenopathy.  Skin:    General: Skin is warm and dry.     Findings: No rash.  Neurological:     Mental Status: She is alert and oriented to person, place, and time.  Psychiatric:        Behavior: Behavior normal.        Thought Content: Thought content normal.     Assessment & Plan:  Kassie Roehr is a 67 y.o. female . Annual physical exam  Hyperlipidemia, unspecified hyperlipidemia type - Plan: Comprehensive metabolic panel, Lipid panel -anticipatory guidance as below in AVS, screening labs above. Health maintenance items as above in HPI discussed/recommended as applicable.  Prior hyperlipidemia but low ASCVD risk score and good HDL.  Recheck labs, no new meds for now.  Paperwork completed for her physical for employer. Discussed need for follow-up mammogram plus or minus ultrasound given prior abnormality.  She would like to have imaging done at location [facility] but was not sure specific location.  She will call and let us know and then we will place referral, including for bone density at that location. Vaccines declined as above.  Recheck in 1  year for physical.  No orders of the defined types were placed in this encounter.  Patient Instructions  You are overdue for a mammogram. Repeat testing with DIAGNOSTIC mammogram and possible ultrasound was recommended in 1 year when checked in 2020. Let me know the imaging facility where I can place order for repeat testing as well bone density, and I will order those tests. Let me know as soon as possible.   If any lab concerns today I will let you know.  Take care!  Health Maintenance After Age 19 After age 66, you are at a higher risk for certain long-term diseases and infections as well as injuries from falls. Falls are a major cause of broken bones and head injuries in people who are older than age 39. Getting regular preventive care can help to keep you healthy and well. Preventive care includes getting regular testing and making lifestyle changes as recommended by your health care provider. Talk with your health care provider about: Which screenings and tests you should have. A screening is a test that checks for a disease when you have no symptoms. A diet and exercise plan that is right for you. What should I know about screenings and tests to prevent falls? Screening and testing are the best ways to find a health problem early. Early diagnosis and treatment give you the best chance of managing medical conditions that are common after age 40. Certain conditions and lifestyle choices may make you more likely to have a fall. Your health care provider may recommend: Regular vision checks. Poor vision and conditions such as cataracts can make you more likely to have a fall. If you wear glasses, make sure to get  your prescription updated if your vision changes. Medicine review. Work with your health care provider to regularly review all of the medicines you are taking, including over-the-counter medicines. Ask your health care provider about any side effects that may make you more likely to have  a fall. Tell your health care provider if any medicines that you take make you feel dizzy or sleepy. Strength and balance checks. Your health care provider may recommend certain tests to check your strength and balance while standing, walking, or changing positions. Foot health exam. Foot pain and numbness, as well as not wearing proper footwear, can make you more likely to have a fall. Screenings, including: Osteoporosis screening. Osteoporosis is a condition that causes the bones to get weaker and break more easily. Blood pressure screening. Blood pressure changes and medicines to control blood pressure can make you feel dizzy. Depression screening. You may be more likely to have a fall if you have a fear of falling, feel depressed, or feel unable to do activities that you used to do. Alcohol use screening. Using too much alcohol can affect your balance and may make you more likely to have a fall. Follow these instructions at home: Lifestyle Do not drink alcohol if: Your health care provider tells you not to drink. If you drink alcohol: Limit how much you have to: 0-1 drink a day for women. 0-2 drinks a day for men. Know how much alcohol is in your drink. In the [place] one drink equals one 12 oz bottle of beer (355 mL), one 5 oz glass of wine (148 mL), or one 1 oz glass of hard liquor (44 mL). Do not use any products that contain nicotine or tobacco. These products include cigarettes, chewing tobacco, and vaping devices, such as e-cigarettes. If you need help quitting, ask your health care provider. Activity  Follow a regular exercise program to stay fit. This will help you maintain your balance. Ask your health care provider what types of exercise are appropriate for you. If you need a cane or walker, use it as recommended by your health care provider. Wear supportive shoes that have nonskid soles. Safety  Remove any tripping hazards, such as rugs, cords, and clutter. Install safety  equipment such as grab bars in bathrooms and safety rails on stairs. Keep rooms and walkways well-lit. General instructions Talk with your health care provider about your risks for falling. Tell your health care provider if: You fall. Be sure to tell your health care provider about all falls, even ones that seem minor. You feel dizzy, tiredness (fatigue), or off-balance. Take over-the-counter and prescription medicines only as told by your health care provider. These include supplements. Eat a healthy diet and maintain a healthy weight. A healthy diet includes low-fat dairy products, low-fat (lean) meats, and fiber from whole grains, beans, and lots of fruits and vegetables. Stay current with your vaccines. Schedule regular health, dental, and eye exams. Summary Having a healthy lifestyle and getting preventive care can help to protect your health and wellness after age 35. Screening and testing are the best way to find a health problem early and help you avoid having a fall. Early diagnosis and treatment give you the best chance for managing medical conditions that are more common for people who are older than age 70. Falls are a major cause of broken bones and head injuries in people who are older than age 48. Take precautions to prevent a fall at home. Work with your health care provider  to learn what changes you can make to improve your health and wellness and to prevent falls. This information is not intended to replace advice given to you by your health care provider. Make sure you discuss any questions you have with your health care provider. Document Revised: 10/24/2020 Document Reviewed: 10/24/2020 Elsevier Patient Education  2024 Elsevier Inc.     Signed,   Meredith Staggers, MD [facility], [facility] 01/28/23 3:22 PM

## 2023-01-28 NOTE — Patient Instructions (Addendum)
You are overdue for a mammogram. Repeat testing with DIAGNOSTIC mammogram and possible ultrasound was recommended in 1 year when checked in 2020. Let me know the imaging facility where I can place order for repeat testing as well bone density, and I will order those tests. Let me know as soon as possible.   If any lab concerns today I will let you know.  Take care!  Health Maintenance After Age 69 After age 51, you are at a higher risk for certain long-term diseases and infections as well as injuries from falls. Falls are a major cause of broken bones and head injuries in people who are older than age 60. Getting regular preventive care can help to keep you healthy and well. Preventive care includes getting regular testing and making lifestyle changes as recommended by your health care provider. Talk with your health care provider about: Which screenings and tests you should have. A screening is a test that checks for a disease when you have no symptoms. A diet and exercise plan that is right for you. What should I know about screenings and tests to prevent falls? Screening and testing are the best ways to find a health problem early. Early diagnosis and treatment give you the best chance of managing medical conditions that are common after age 6. Certain conditions and lifestyle choices may make you more likely to have a fall. Your health care provider may recommend: Regular vision checks. Poor vision and conditions such as cataracts can make you more likely to have a fall. If you wear glasses, make sure to get your prescription updated if your vision changes. Medicine review. Work with your health care provider to regularly review all of the medicines you are taking, including over-the-counter medicines. Ask your health care provider about any side effects that may make you more likely to have a fall. Tell your health care provider if any medicines that you take make you feel dizzy or  sleepy. Strength and balance checks. Your health care provider may recommend certain tests to check your strength and balance while standing, walking, or changing positions. Foot health exam. Foot pain and numbness, as well as not wearing proper footwear, can make you more likely to have a fall. Screenings, including: Osteoporosis screening. Osteoporosis is a condition that causes the bones to get weaker and break more easily. Blood pressure screening. Blood pressure changes and medicines to control blood pressure can make you feel dizzy. Depression screening. You may be more likely to have a fall if you have a fear of falling, feel depressed, or feel unable to do activities that you used to do. Alcohol use screening. Using too much alcohol can affect your balance and may make you more likely to have a fall. Follow these instructions at home: Lifestyle Do not drink alcohol if: Your health care provider tells you not to drink. If you drink alcohol: Limit how much you have to: 0-1 drink a day for women. 0-2 drinks a day for men. Know how much alcohol is in your drink. In the [place] one drink equals one 12 oz bottle of beer (355 mL), one 5 oz glass of wine (148 mL), or one 1 oz glass of hard liquor (44 mL). Do not use any products that contain nicotine or tobacco. These products include cigarettes, chewing tobacco, and vaping devices, such as e-cigarettes. If you need help quitting, ask your health care provider. Activity  Follow a regular exercise program to stay fit. This will help  you maintain your balance. Ask your health care provider what types of exercise are appropriate for you. If you need a cane or walker, use it as recommended by your health care provider. Wear supportive shoes that have nonskid soles. Safety  Remove any tripping hazards, such as rugs, cords, and clutter. Install safety equipment such as grab bars in bathrooms and safety rails on stairs. Keep rooms and walkways  well-lit. General instructions Talk with your health care provider about your risks for falling. Tell your health care provider if: You fall. Be sure to tell your health care provider about all falls, even ones that seem minor. You feel dizzy, tiredness (fatigue), or off-balance. Take over-the-counter and prescription medicines only as told by your health care provider. These include supplements. Eat a healthy diet and maintain a healthy weight. A healthy diet includes low-fat dairy products, low-fat (lean) meats, and fiber from whole grains, beans, and lots of fruits and vegetables. Stay current with your vaccines. Schedule regular health, dental, and eye exams. Summary Having a healthy lifestyle and getting preventive care can help to protect your health and wellness after age 67. Screening and testing are the best way to find a health problem early and help you avoid having a fall. Early diagnosis and treatment give you the best chance for managing medical conditions that are more common for people who are older than age 68. Falls are a major cause of broken bones and head injuries in people who are older than age 31. Take precautions to prevent a fall at home. Work with your health care provider to learn what changes you can make to improve your health and wellness and to prevent falls. This information is not intended to replace advice given to you by your health care provider. Make sure you discuss any questions you have with your health care provider. Document Revised: 10/24/2020 Document Reviewed: 10/24/2020 Elsevier Patient Education  2024 [employer].

## 2023-01-30 ENCOUNTER — Telehealth: Payer: Self-pay

## 2023-01-30 NOTE — Telephone Encounter (Signed)
Caller name: Makiyah Hauver  On DPR?: Yes  Call back number: [phone] (mobile)  Provider they see: Shade Flood, MD  Reason for call: Pt was returning call to receive lab results. Asked for a nurse to call her back

## 2023-01-30 NOTE — Telephone Encounter (Signed)
-----   Message from Shade Flood sent at 01/29/2023  9:13 PM EDT ----- Cholesterol elevated but similar to previous readings.  No new meds recommended at this time.  Electrolytes, kidney, liver tests were normal.  Did she decide where she would like to be referred for her mammogram, and bone density?

## 2023-01-30 NOTE — Telephone Encounter (Signed)
Pt has been notified and will call us with the place she would like to go.
